# Patient Record
Sex: Female | Born: 1990
Health system: Southern US, Community
[De-identification: ages and names within clinical notes are randomized; demographics above are authoritative.]

## PROBLEM LIST (undated history)

## (undated) ENCOUNTER — Inpatient Hospital Stay (HOSPITAL_COMMUNITY): Payer: Self-pay

## (undated) DIAGNOSIS — D649 Anemia, unspecified: Secondary | ICD-10-CM

## (undated) DIAGNOSIS — Z789 Other specified health status: Secondary | ICD-10-CM

## (undated) DIAGNOSIS — O039 Complete or unspecified spontaneous abortion without complication: Secondary | ICD-10-CM

## (undated) DIAGNOSIS — T8859XA Other complications of anesthesia, initial encounter: Secondary | ICD-10-CM

## (undated) DIAGNOSIS — T4145XA Adverse effect of unspecified anesthetic, initial encounter: Secondary | ICD-10-CM

## (undated) DIAGNOSIS — Z973 Presence of spectacles and contact lenses: Secondary | ICD-10-CM

---
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
## 2011-06-24 LAB — GLUCOSE, CAPILLARY: Glucose-Capillary: 99

## 2011-09-28 ENCOUNTER — Ambulatory Visit: Payer: Self-pay | Admitting: Gynecology

## 2011-09-28 VITALS — BP 103/71 | Wt 159.0 lb

## 2011-09-28 DIAGNOSIS — Z3480 Encounter for supervision of other normal pregnancy, unspecified trimester: Secondary | ICD-10-CM

## 2011-09-28 DIAGNOSIS — O3680X0 Pregnancy with inconclusive fetal viability, not applicable or unspecified: Secondary | ICD-10-CM

## 2011-09-28 LAB — POCT URINE PREGNANCY: Preg Test, Ur: POSITIVE

## 2011-09-29 ENCOUNTER — Encounter (HOSPITAL_COMMUNITY): Payer: Self-pay | Admitting: *Deleted

## 2011-09-29 ENCOUNTER — Inpatient Hospital Stay (HOSPITAL_COMMUNITY)
Admission: AD | Admit: 2011-09-29 | Discharge: 2011-09-30 | Disposition: A | Discharge status: Home / Self Care | Payer: Medicaid Other | Source: Ambulatory Visit | Attending: Family Medicine | Admitting: Family Medicine

## 2011-09-29 DIAGNOSIS — O039 Complete or unspecified spontaneous abortion without complication: Secondary | ICD-10-CM | POA: Insufficient documentation

## 2011-09-29 HISTORY — DX: Other specified health status: Z78.9

## 2011-09-29 LAB — OBSTETRIC PANEL
Basophils Relative: 0 % (ref 0–1)
Eosinophils Absolute: 0.1 10*3/uL (ref 0.0–0.7)
Hepatitis B Surface Ag: NEGATIVE
MCH: 31.4 pg (ref 26.0–34.0)
MCHC: 33.9 g/dL (ref 30.0–36.0)
Neutrophils Relative %: 69 % (ref 43–77)
Platelets: 194 10*3/uL (ref 150–400)
RBC: 4.17 MIL/uL (ref 3.87–5.11)

## 2011-09-29 LAB — HIV ANTIBODY (ROUTINE TESTING W REFLEX): HIV: NONREACTIVE

## 2011-09-29 NOTE — ED Provider Notes (Signed)
History     Chief Complaint  Patient presents with  . Vaginal Bleeding   HPI 21 y.o. with bleeding x 3 days, pink initially, now like a period, + cramping this morning.    Past Medical History  Diagnosis Date  . No pertinent past medical history     Past Surgical History  Procedure Date  . Cesarean section     Family History  Problem Relation Age of Onset  . Diabetes Mother   . Hyperlipidemia Father   . Asthma Sister   . Hypertension Sister     History  Substance Use Topics  . Smoking status: Never Smoker   . Smokeless tobacco: Not on file  . Alcohol Use: No    Allergies: Not on File  No prescriptions prior to admission    Review of Systems  Constitutional: Negative.   Respiratory: Negative.   Cardiovascular: Negative.   Gastrointestinal: Negative for nausea, vomiting, abdominal pain, diarrhea and constipation.  Genitourinary: Negative for dysuria, urgency, frequency, hematuria and flank pain.       Positive for bleeding and cramping  Musculoskeletal: Negative.   Neurological: Negative.   Psychiatric/Behavioral: Negative.    Physical Exam   Blood pressure 115/77, pulse 88, temperature 98.8 F (37.1 C), temperature source Oral, resp. rate 20, height 5\' 1"  (1.549 m), weight 159 lb (72.122 kg).  Physical Exam  Constitutional: She is oriented to person, place, and time. She appears well-developed and well-nourished. No distress.  HENT:  Head: Normocephalic and atraumatic.  Cardiovascular: Normal rate, regular rhythm and normal heart sounds.   Respiratory: Effort normal and breath sounds normal. No respiratory distress.  GI: Soft. Bowel sounds are normal. She exhibits no distension and no mass. There is no tenderness. There is no rebound and no guarding.  Genitourinary: There is no rash or lesion on the right labia. There is no rash or lesion on the left labia. Uterus is not deviated, not enlarged, not fixed and not tender. Cervix exhibits no motion  tenderness, no discharge and no friability. Right adnexum displays no mass, no tenderness and no fullness. Left adnexum displays no mass, no tenderness and no fullness. There is bleeding (moderate) around the vagina. No erythema or tenderness around the vagina. No vaginal discharge found.  Neurological: She is alert and oriented to person, place, and time.  Skin: Skin is warm and dry.  Psychiatric: She has a normal mood and affect.    MAU Course  Procedures  Results for orders placed during the hospital encounter of 09/29/11 (from the past 24 hour(s))  CBC     Status: Abnormal   Collection Time   09/29/11 11:59 PM      Component Value Range   WBC 11.4 (*) 4.0 - 10.5 (K/uL)   RBC 3.99  3.87 - 5.11 (MIL/uL)   Hemoglobin 12.6  12.0 - 15.0 (g/dL)   HCT 16.1 (*) 09.6 - 46.0 (%)   MCV 89.7  78.0 - 100.0 (fL)   MCH 31.6  26.0 - 34.0 (pg)   MCHC 35.2  30.0 - 36.0 (g/dL)   RDW 04.5  40.9 - 81.1 (%)   Platelets 178  150 - 400 (K/uL)   U/S: [redacted]w[redacted]d fetal pole with no cardiac activity   Assessment and Plan  21 y.o. G2P1 with 10 week SAB  Discussed cytotec vs. Expectant mgmt, pt desires expectant mgmt at this time Precautions rev'd F/U in office in 2 weeks or sooner PRN  Bryler Dibble 09/30/2011, 12:14 AM

## 2011-09-29 NOTE — Progress Notes (Signed)
Patient states she started bleeding 3 days ago. It started as a light pink and now the bleeding is as heavy as a period. Cramping started this am.

## 2011-09-30 ENCOUNTER — Other Ambulatory Visit: Payer: Self-pay | Admitting: Obstetrics & Gynecology

## 2011-09-30 ENCOUNTER — Inpatient Hospital Stay (HOSPITAL_COMMUNITY): Payer: Medicaid Other

## 2011-09-30 ENCOUNTER — Inpatient Hospital Stay (HOSPITAL_COMMUNITY)
Admission: AD | Admit: 2011-09-30 | Discharge: 2011-10-01 | Disposition: A | Discharge status: Home / Self Care | Payer: Medicaid Other | Source: Ambulatory Visit | Attending: Obstetrics & Gynecology | Admitting: Obstetrics & Gynecology

## 2011-09-30 ENCOUNTER — Ambulatory Visit (HOSPITAL_COMMUNITY): Admission: RE | Admit: 2011-09-30 | Payer: Self-pay | Source: Ambulatory Visit

## 2011-09-30 ENCOUNTER — Encounter (HOSPITAL_COMMUNITY): Payer: Self-pay

## 2011-09-30 DIAGNOSIS — O039 Complete or unspecified spontaneous abortion without complication: Secondary | ICD-10-CM

## 2011-09-30 LAB — CBC
HCT: 30.3 % — ABNORMAL LOW (ref 36.0–46.0)
Hemoglobin: 10.6 g/dL — ABNORMAL LOW (ref 12.0–15.0)
Hemoglobin: 12.6 g/dL (ref 12.0–15.0)
MCH: 31.6 pg (ref 26.0–34.0)
MCV: 89.7 fL (ref 78.0–100.0)
Platelets: 178 10*3/uL (ref 150–400)
RBC: 3.35 MIL/uL — ABNORMAL LOW (ref 3.87–5.11)
RBC: 3.99 MIL/uL (ref 3.87–5.11)
WBC: 11.4 10*3/uL — ABNORMAL HIGH (ref 4.0–10.5)

## 2011-09-30 LAB — CULTURE, URINE COMPREHENSIVE: Colony Count: NO GROWTH

## 2011-09-30 LAB — URINALYSIS, ROUTINE W REFLEX MICROSCOPIC
Bilirubin Urine: NEGATIVE
Glucose, UA: NEGATIVE mg/dL
Specific Gravity, Urine: 1.01 (ref 1.005–1.030)
Urobilinogen, UA: 0.2 mg/dL (ref 0.0–1.0)
pH: 6.5 (ref 5.0–8.0)

## 2011-09-30 LAB — URINE MICROSCOPIC-ADD ON

## 2011-09-30 LAB — GC/CHLAMYDIA PROBE AMP, GENITAL: GC Probe Amp, Genital: NEGATIVE

## 2011-09-30 LAB — WET PREP, GENITAL
Trich, Wet Prep: NONE SEEN
Yeast Wet Prep HPF POC: NONE SEEN

## 2011-09-30 MED ORDER — MISOPROSTOL 200 MCG PO TABS
800.0000 ug | ORAL_TABLET | Freq: Once | ORAL | Status: AC
  Administered 2011-09-30: 800 ug via RECTAL
  Filled 2011-09-30: qty 4

## 2011-09-30 MED ORDER — KETOROLAC TROMETHAMINE 30 MG/ML IJ SOLN
30.0000 mg | Freq: Once | INTRAMUSCULAR | Status: AC
  Administered 2011-09-30: 30 mg via INTRAVENOUS
  Filled 2011-09-30: qty 1

## 2011-09-30 MED ORDER — HYDROMORPHONE HCL PF 1 MG/ML IJ SOLN
1.0000 mg | Freq: Once | INTRAMUSCULAR | Status: DC
  Filled 2011-09-30: qty 1

## 2011-09-30 MED ORDER — LACTATED RINGERS IV SOLN
Freq: Once | INTRAVENOUS | Status: AC
  Administered 2011-09-30: 23:00:00 via INTRAVENOUS

## 2011-09-30 MED ORDER — LACTATED RINGERS IV SOLN
Freq: Once | INTRAVENOUS | Status: AC
  Administered 2011-09-30: 22:00:00 via INTRAVENOUS

## 2011-09-30 MED ORDER — METHYLERGONOVINE MALEATE 0.2 MG/ML IJ SOLN
0.2000 mg | Freq: Once | INTRAMUSCULAR | Status: AC
  Administered 2011-09-30: 0.2 mg via INTRAMUSCULAR
  Filled 2011-09-30: qty 1

## 2011-09-30 NOTE — ED Provider Notes (Signed)
History     Chief Complaint  Patient presents with  . Vaginal Bleeding   HPI  Pt seen in MAU yesterday for pelvic pain.  Ultrasound completed and showed a 9 wk demise.  Pt in tonight with vaginal bleeding and cramping.  Past Medical History  Diagnosis Date  . No pertinent past medical history     Past Surgical History  Procedure Date  . Cesarean section     Family History  Problem Relation Age of Onset  . Diabetes Mother   . Hyperlipidemia Father   . Asthma Sister   . Hypertension Sister     History  Substance Use Topics  . Smoking status: Never Smoker   . Smokeless tobacco: Not on file  . Alcohol Use: No    Allergies: No Known Allergies  Prescriptions prior to admission  Medication Sig Dispense Refill  . acetaminophen (TYLENOL) 325 MG tablet Take 650 mg by mouth every 6 (six) hours as needed. For pain       . Prenatal Vit-Fe Fumarate-FA (PRENATAL MULTIVITAMIN) TABS Take 1 tablet by mouth daily.          Review of Systems  Gastrointestinal: Positive for abdominal pain.  Genitourinary:       Vaginal bleeding  All other systems reviewed and are negative.   Physical Exam   Blood pressure 121/74, pulse 90, temperature 99 F (37.2 C), temperature source Oral, resp. rate 20, SpO2 96.00%.  Physical Exam  Constitutional: She is oriented to person, place, and time. She appears well-developed and well-nourished. She appears distressed.       Uncomfortable appearing  HENT:  Head: Normocephalic.  Neck: Normal range of motion. Neck supple.  Cardiovascular: Normal rate, regular rhythm and normal heart sounds.   Respiratory: Effort normal and breath sounds normal. No respiratory distress.  GI: Soft. She exhibits no mass. There is tenderness (suprapubic). There is no rebound and no guarding.  Genitourinary: There is bleeding (moderate; +clots) around the vagina.  Neurological: She is alert and oriented to person, place, and time.  Skin: Skin is warm and dry.     MAU Course  Procedures  Consult with Dr. Lennox Solders cytotec  IV LR 1000 x 2 Cytotec 800 mcg rectally  Dr. Despina Hidden in to evaluate pt>obtains tissue from cervical os; IM Methergine given  Assessment and Plan  Miscarriage  Plan: DC to home PO Methergine, 0.2 mg every 4 hours x 5 Bleeding precautions given Keep scheduled appointment at [facility] in two weeks.  [facility] 09/30/2011, 10:18 PM

## 2011-09-30 NOTE — ED Provider Notes (Signed)
Chart reviewed and agree with management and plan.  

## 2011-09-30 NOTE — ED Notes (Signed)
Changed pad under patient, large amount of bright red blood noted, no active vaginal bleeding noted.

## 2011-09-30 NOTE — Consult Note (Signed)
Dr. Despina Hidden notified regarding pt bleeding/vitals>will come evaluate pt

## 2011-09-30 NOTE — ED Notes (Signed)
Speculum exam by Dr. Despina Hidden, fetal tissue removed.

## 2011-09-30 NOTE — ED Notes (Signed)
Patient discharged at 0110. Epic down during this time. See paper chart.

## 2011-10-01 MED ORDER — ONDANSETRON HCL 4 MG/2ML IJ SOLN
4.0000 mg | Freq: Once | INTRAMUSCULAR | Status: AC
  Administered 2011-10-01: 4 mg via INTRAVENOUS
  Filled 2011-10-01: qty 2

## 2011-10-01 MED ORDER — METHYLERGONOVINE MALEATE 0.2 MG PO TABS: 0.2000 mg | ORAL_TABLET | Freq: Four times a day (QID) | ORAL | Status: AC

## 2011-10-01 MED ORDER — LACTATED RINGERS IV SOLN: Freq: Once | INTRAVENOUS | Status: DC

## 2011-10-01 NOTE — Progress Notes (Signed)
Pt bleeding decreased on pad; light, no clots; reports an improvement in uterine pain.

## 2011-10-06 ENCOUNTER — Encounter: Payer: Self-pay | Admitting: Obstetrics and Gynecology

## 2011-10-07 ENCOUNTER — Encounter: Payer: Self-pay | Admitting: Obstetrics & Gynecology

## 2011-10-12 ENCOUNTER — Ambulatory Visit (INDEPENDENT_AMBULATORY_CARE_PROVIDER_SITE_OTHER): Payer: Self-pay | Admitting: Family Medicine

## 2011-10-12 ENCOUNTER — Encounter: Payer: Self-pay | Admitting: Family Medicine

## 2011-10-12 VITALS — BP 96/65 | HR 82 | Ht 61.0 in | Wt 158.0 lb

## 2011-10-12 DIAGNOSIS — O039 Complete or unspecified spontaneous abortion without complication: Secondary | ICD-10-CM

## 2011-10-12 NOTE — Progress Notes (Signed)
History Here 2 wks after SAB at hospital.  Her bleeding has diminished.  Her mood is well.  Discussed risks of pregnancy within 3 mos. Of miscarriage, likely causes, recurrence risk.   Physical exam Filed Vitals:   10/12/11 1521  BP: 96/65  Pulse: 82  abdomen:  Soft, NT, uterus is small and involuted.  Assessment S/p SAB  Plan F/u 3 mos.

## 2011-10-12 NOTE — Patient Instructions (Signed)
Miscarriage An early pregnancy loss or spontaneous abortion (miscarriage) is a common problem. This usually happens when the pregnancy is not developing normally. It is very unlikely that you or your partner did anything to cause this, although cigarette smoking, a sexually transmitted disease, excessive alcohol use, or drug abuse can increase the risk. Other causes are:  Abnormalities of the uterus.   Hormone or medical problems.   Trauma or genetic (chromosome) problems.  Having a miscarriage does not change your chances of having a normal pregnancy in the future. Your caregiver will advise you when it is safe to try to get pregnant again. AFTER A MISCARRIAGE  A miscarriage is inevitable when there is continual, heavy vaginal bleeding; cramping; dilation of the cervix; or passing of any pregnancy tissue. Bleeding and cramping will usually continue until all the tissue has been removed from the womb (uterus).   Often the uterus does not clean itself out completely. A medication or a D&C procedure is needed to loosen or remove the pregnancy tissue from the uterus. A D&C scrapes or suctions the tissue out.   If you are RH negative, you may need to have Rh immune globulin to avoid Rh problems.   You may be given medication to fight an infection if the miscarriage was due to an infection.  HOME CARE INSTRUCTIONS   You should rest in bed for the next 2 to 3 days.   Do not take tub baths or put anything in your vagina, including tampons or a douche.   Do not have sex until your caregiver approves.   Avoid exercise or heavy activities until directed by your caregiver.   Save any vaginal discharge that looks like tissue. Ask your caregiver if he or she wants to inspect the discharge.   If you and your partner are having problems with guilt or grieving, talk to your caregiver or get counseling to help you understand and cope with your pregnancy loss.   Allow enough time to grieve before  trying to get pregnant again.  SEEK IMMEDIATE MEDICAL CARE IF:   You have persistent heavy bleeding or a bad smelling vaginal discharge.   You have continued abdominal or pelvic pain.   You have an oral temperature above 102 F (38.9 C), not controlled by medicine.   You have severe weakness, fainting, or keep throwing up (vomiting).   You develop chills.   You are experiencing domestic violence.  MAKE SURE YOU:   Understand these instructions.   Will watch your condition.   Will get help right away if you are not doing well or get worse.  Document Released: 10/15/2004 Document Revised: 05/20/2011 Document Reviewed: 08/30/2008 Exit[employer] Patient Information 2012 [employer]. 

## 2011-11-19 ENCOUNTER — Encounter (HOSPITAL_COMMUNITY): Payer: Self-pay

## 2012-11-07 ENCOUNTER — Ambulatory Visit (INDEPENDENT_AMBULATORY_CARE_PROVIDER_SITE_OTHER): Payer: Self-pay | Admitting: *Deleted

## 2012-11-07 ENCOUNTER — Encounter: Payer: Self-pay | Admitting: *Deleted

## 2012-11-07 VITALS — BP 106/74 | Wt 169.0 lb

## 2012-11-07 DIAGNOSIS — Z3480 Encounter for supervision of other normal pregnancy, unspecified trimester: Secondary | ICD-10-CM

## 2012-11-07 LAB — HIV ANTIBODY (ROUTINE TESTING W REFLEX): HIV: NONREACTIVE

## 2012-11-07 NOTE — Progress Notes (Signed)
Patient is here for New OB intake, she did miscarry with her previous pregnancy and would like to have a blood test today to confirm her HCG level looks appropriate.  She will consider first trimester screening, she is early so has time to discuss with her sister and her husband.  She will follow back up here with the physician within two weeks.  Blood work done today.

## 2012-11-08 LAB — OBSTETRIC PANEL
Antibody Screen: NEGATIVE
Eosinophils Relative: 1 % (ref 0–5)
HCT: 36.6 % (ref 36.0–46.0)
Lymphocytes Relative: 25 % (ref 12–46)
Lymphs Abs: 1.7 10*3/uL (ref 0.7–4.0)
MCV: 89.3 fL (ref 78.0–100.0)
Monocytes Absolute: 0.4 10*3/uL (ref 0.1–1.0)
Monocytes Relative: 6 % (ref 3–12)
RBC: 4.1 MIL/uL (ref 3.87–5.11)
Rh Type: POSITIVE
Rubella: 0.99 Index — ABNORMAL HIGH (ref <0.90)
WBC: 6.8 10*3/uL (ref 4.0–10.5)

## 2012-11-22 ENCOUNTER — Encounter: Payer: Self-pay | Admitting: Obstetrics & Gynecology

## 2012-11-28 ENCOUNTER — Ambulatory Visit (INDEPENDENT_AMBULATORY_CARE_PROVIDER_SITE_OTHER): Payer: Self-pay | Admitting: Obstetrics & Gynecology

## 2012-11-28 VITALS — BP 121/75 | Wt 175.0 lb

## 2012-11-28 DIAGNOSIS — O34219 Maternal care for unspecified type scar from previous cesarean delivery: Secondary | ICD-10-CM

## 2012-11-28 DIAGNOSIS — Z124 Encounter for screening for malignant neoplasm of cervix: Secondary | ICD-10-CM

## 2012-11-28 DIAGNOSIS — Z113 Encounter for screening for infections with a predominantly sexual mode of transmission: Secondary | ICD-10-CM

## 2012-11-28 DIAGNOSIS — Z3480 Encounter for supervision of other normal pregnancy, unspecified trimester: Secondary | ICD-10-CM

## 2012-11-28 NOTE — Progress Notes (Signed)
   Subjective:    Victoria Lopez is a G3P2 [redacted]w[redacted]d being seen today for her first obstetrical visit.  Patient does intend to breast feed. Pregnancy history fully reviewed.  Patient reports no complaints.  Filed Vitals:   11/28/12 1321  BP: 121/75  Weight: 175 lb (79.379 kg)    HISTORY: OB History   Grav Para Term Preterm Abortions TAB SAB Ect Mult Living   3 1        1      # Outc Date GA Lbr Len/2nd Wgt Sex Del Anes PTL Lv   1 TRM 11/08 [redacted]w[redacted]d  8lb4oz(3.742kg) F SVD EPI No Yes   2 SAB 1/13        ND   Comments: System Generated. Please review and update pregnancy details.   3 CUR            4 PAR      CS      5 GRA            Comments: EDD: 03/27/12; System Generated. Please review and update pregnancy details.     Past Medical History  Diagnosis Date  . No pertinent past medical history    Past Surgical History  Procedure Laterality Date  . Cesarean section     Family History  Problem Relation Age of Onset  . Diabetes Mother   . Hyperlipidemia Father   . Hypertension Sister   . Asthma Sister   . Alcohol abuse Paternal Grandfather      Exam    Uterus:     Pelvic Exam:    Perineum: No Hemorrhoids, Normal Perineum   Vulva: normal   Vagina:  normal mucosa, normal discharge   Cervix: no bleeding following Pap   Adnexa: normal adnexa and no mass, fullness, tenderness   Bony Pelvis: average  System: Breast:  normal appearance, no masses or tenderness   Skin: normal coloration and turgor, no rashes    Neurologic: oriented, normal   Extremities: normal strength, tone, and muscle mass   HEENT PERRLA   Mouth/Teeth mucous membranes moist, pharynx normal without lesions and dental hygiene good   Neck supple and no masses   Cardiovascular: regular rate and rhythm   Respiratory:  appears well, vitals normal, no respiratory distress, acyanotic, normal RR   Abdomen: soft, non-tender; bowel sounds normal; no masses,  no organomegaly   Urinary: urethral meatus normal       Assessment:    Pregnancy: G3P1 Patient Active Problem List  Diagnosis  . Previous cesarean delivery, antepartum condition or complication      Plan:   Initial labs normal Continue prenatal vitamins. Problem list reviewed and updated. Genetic Screening discussed First Screen, Integrated Screen and Quad Screen: desires Quad screen, will be drawn in second trimester Ultrasound discussed; fetal survey: will be ordered later. Follow up in 4 weeks. Bleeding precautions reviewed.    ANYANWU,UGONNA A 11/28/2012

## 2012-11-28 NOTE — Patient Instructions (Signed)
Pregnancy - First Trimester During sexual intercourse, millions of sperm go into the vagina. Only 1 sperm will penetrate and fertilize the female egg while it is in the Fallopian tube. One week later, the fertilized egg implants into the wall of the uterus. An embryo begins to develop into a baby. At 6 to 8 weeks, the eyes and face are formed and the heartbeat can be seen on ultrasound. At the end of 12 weeks (first trimester), all the baby's organs are formed. Now that you are pregnant, you will want to do everything you can to have a healthy baby. Two of the most important things are to get good prenatal care and follow your caregiver's instructions. Prenatal care is all the medical care you receive before the baby's birth. It is given to prevent, find, and treat problems during the pregnancy and childbirth. PRENATAL EXAMS  During prenatal visits, your weight, blood pressure and urine are checked. This is done to make sure you are healthy and progressing normally during the pregnancy.  A pregnant woman should gain 25 to 35 pounds during the pregnancy. However, if you are over weight or underweight, your caregiver will advise you regarding your weight.  Your caregiver will ask and answer questions for you.  Blood work, cervical cultures, other necessary tests and a Pap test are done during your prenatal exams. These tests are done to check on your health and the probable health of your baby. Tests are strongly recommended and done for HIV with your permission. This is the virus that causes AIDS. These tests are done because medications can be given to help prevent your baby from being born with this infection should you have been infected without knowing it. Blood work is also used to find out your blood type, previous infections and follow your blood levels (hemoglobin).  Low hemoglobin (anemia) is common during pregnancy. Iron and vitamins are given to help prevent this. Later in the pregnancy, blood  tests for diabetes will be done along with any other tests if any problems develop. You may need tests to make sure you and the baby are doing well.  You may need other tests to make sure you and the baby are doing well. CHANGES DURING THE FIRST TRIMESTER (THE FIRST 3 MONTHS OF PREGNANCY) Your body goes through many changes during pregnancy. They vary from person to person. Talk to your caregiver about changes you notice and are concerned about. Changes can include:  Your menstrual period stops.  The egg and sperm carry the genes that determine what you look like. Genes from you and your partner are forming a baby. The female genes determine whether the baby is a boy or a girl.  Your body increases in girth and you may feel bloated.  Feeling sick to your stomach (nauseous) and throwing up (vomiting). If the vomiting is uncontrollable, call your caregiver.  Your breasts will begin to enlarge and become tender.  Your nipples may stick out more and become darker.  The need to urinate more. Painful urination may mean you have a bladder infection.  Tiring easily.  Loss of appetite.  Cravings for certain kinds of food.  At first, you may gain or lose a couple of pounds.  You may have changes in your emotions from day to day (excited to be pregnant or concerned something may go wrong with the pregnancy and baby).  You may have more vivid and strange dreams. HOME CARE INSTRUCTIONS   It is very important   to avoid all smoking, alcohol and un-prescribed drugs during your pregnancy. These affect the formation and growth of the baby. Avoid chemicals while pregnant to ensure the delivery of a healthy infant.  Start your prenatal visits by the 12th week of pregnancy. They are usually scheduled monthly at first, then more often in the last 2 months before delivery. Keep your caregiver's appointments. Follow your caregiver's instructions regarding medication use, blood and lab tests, exercise, and  diet.  During pregnancy, you are providing food for you and your baby. Eat regular, well-balanced meals. Choose foods such as meat, fish, milk and other low fat dairy products, vegetables, fruits, and whole-grain breads and cereals. Your caregiver will tell you of the ideal weight gain.  You can help morning sickness by keeping soda crackers at the bedside. Eat a couple before arising in the morning. You may want to use the crackers without salt on them.  Eating 4 to 5 small meals rather than 3 large meals a day also may help the nausea and vomiting.  Drinking liquids between meals instead of during meals also seems to help nausea and vomiting.  A physical sexual relationship may be continued throughout pregnancy if there are no other problems. Problems may be early (premature) leaking of amniotic fluid from the membranes, vaginal bleeding, or belly (abdominal) pain.  Exercise regularly if there are no restrictions. Check with your caregiver or physical therapist if you are unsure of the safety of some of your exercises. Greater weight gain will occur in the last 2 trimesters of pregnancy. Exercising will help:  Control your weight.  Keep you in shape.  Prepare you for labor and delivery.  Help you lose your pregnancy weight after you deliver your baby.  Wear a good support or jogging bra for breast tenderness during pregnancy. This may help if worn during sleep too.  Ask when prenatal classes are available. Begin classes when they are offered.  Do not use hot tubs, steam rooms or saunas.  Wear your seat belt when driving. This protects you and your baby if you are in an accident.  Avoid raw meat, uncooked cheese, cat litter boxes and soil used by cats throughout the pregnancy. These carry germs that can cause birth defects in the baby.  The first trimester is a good time to visit your dentist for your dental health. Getting your teeth cleaned is OK. Use a softer toothbrush and brush  gently during pregnancy.  Ask for help if you have financial, counseling or nutritional needs during pregnancy. Your caregiver will be able to offer counseling for these needs as well as refer you for other special needs.  Do not take any medications or herbs unless told by your caregiver.  Inform your caregiver if there is any mental or physical domestic violence.  Make a list of emergency phone numbers of family, friends, hospital, and police and fire departments.  Write down your questions. Take them to your prenatal visit.  Do not douche.  Do not cross your legs.  If you have to stand for long periods of time, rotate you feet or take small steps in a circle.  You may have more vaginal secretions that may require a sanitary pad. Do not use tampons or scented sanitary pads. MEDICATIONS AND DRUG USE IN PREGNANCY  Take prenatal vitamins as directed. The vitamin should contain 1 milligram of folic acid. Keep all vitamins out of reach of children. Only a couple vitamins or tablets containing iron may be   fatal to a baby or young child when ingested.  Avoid use of all medications, including herbs, over-the-counter medications, not prescribed or suggested by your caregiver. Only take over-the-counter or prescription medicines for pain, discomfort, or fever as directed by your caregiver. Do not use aspirin, ibuprofen, or naproxen unless directed by your caregiver.  Let your caregiver also know about herbs you may be using.  Alcohol is related to a number of birth defects. This includes fetal alcohol syndrome. All alcohol, in any form, should be avoided completely. Smoking will cause low birth rate and premature babies.  Street or illegal drugs are very harmful to the baby. They are absolutely forbidden. A baby born to an addicted mother will be addicted at birth. The baby will go through the same withdrawal an adult does.  Let your caregiver know about any medications that you have to take  and for what reason you take them. MISCARRIAGE IS COMMON DURING PREGNANCY A miscarriage does not mean you did something wrong. It is not a reason to worry about getting pregnant again. Your caregiver will help you with questions you may have. If you have a miscarriage, you may need minor surgery. SEEK MEDICAL CARE IF:  You have any concerns or worries during your pregnancy. It is better to call with your questions if you feel they cannot wait, rather than worry about them. SEEK IMMEDIATE MEDICAL CARE IF:   An unexplained oral temperature above 102 F (38.9 C) develops, or as your caregiver suggests.  You have leaking of fluid from the vagina (birth canal). If leaking membranes are suspected, take your temperature and inform your caregiver of this when you call.  There is vaginal spotting or bleeding. Notify your caregiver of the amount and how many pads are used.  You develop a bad smelling vaginal discharge with a change in the color.  You continue to feel sick to your stomach (nauseated) and have no relief from remedies suggested. You vomit blood or coffee ground-like materials.  You lose more than 2 pounds of weight in 1 week.  You gain more than 2 pounds of weight in 1 week and you notice swelling of your face, hands, feet, or legs.  You gain 5 pounds or more in 1 week (even if you do not have swelling of your hands, face, legs, or feet).  You get exposed to German measles and have never had them.  You are exposed to fifth disease or chickenpox.  You develop belly (abdominal) pain. Round ligament discomfort is a common non-cancerous (benign) cause of abdominal pain in pregnancy. Your caregiver still must evaluate this.  You develop headache, fever, diarrhea, pain with urination, or shortness of breath.  You fall or are in a car accident or have any kind of trauma.  There is mental or physical violence in your home. Document Released: 09/01/2001 Document Revised: 11/30/2011  Document Reviewed: 03/05/2009 [employer] Patient Information 2013 [employer].  

## 2012-11-28 NOTE — Progress Notes (Signed)
P - 81 - Pt is here one day early for appt due to brownish discharge. Discharge has been 2 days - pt states no pain/pressure/contractions.

## 2012-11-29 ENCOUNTER — Encounter: Payer: Self-pay | Admitting: Obstetrics and Gynecology

## 2012-11-29 ENCOUNTER — Encounter: Payer: Self-pay | Admitting: Obstetrics & Gynecology

## 2012-12-01 ENCOUNTER — Inpatient Hospital Stay (HOSPITAL_COMMUNITY): Payer: Self-pay

## 2012-12-01 ENCOUNTER — Encounter (HOSPITAL_COMMUNITY): Payer: Self-pay | Admitting: *Deleted

## 2012-12-01 ENCOUNTER — Inpatient Hospital Stay (HOSPITAL_COMMUNITY)
Admission: AD | Admit: 2012-12-01 | Discharge: 2012-12-01 | Disposition: A | Discharge status: Home / Self Care | Payer: Self-pay | Source: Ambulatory Visit | Attending: Obstetrics and Gynecology | Admitting: Obstetrics and Gynecology

## 2012-12-01 DIAGNOSIS — O200 Threatened abortion: Secondary | ICD-10-CM

## 2012-12-01 DIAGNOSIS — O021 Missed abortion: Secondary | ICD-10-CM | POA: Insufficient documentation

## 2012-12-01 HISTORY — DX: Other specified health status: Z78.9

## 2012-12-01 LAB — CBC
HCT: 36.6 % (ref 36.0–46.0)
MCHC: 34.2 g/dL (ref 30.0–36.0)
MCV: 89.3 fL (ref 78.0–100.0)
RDW: 12.8 % (ref 11.5–15.5)

## 2012-12-01 NOTE — MAU Note (Signed)
Brown d/c started on Sun, called [facility]- was told ok if not active - bright red bleeding.  Now is bright red, covered a panti liner today. Pelvic pressure, no pain. Hx of SAB

## 2012-12-01 NOTE — MAU Note (Signed)
Pt reports vaginal bleeding today, started as brownish discharge but then changed to bright red. On tissue when she wipes and also on her underwear. Lower back pain and lower abd pressure this pm.

## 2012-12-01 NOTE — MAU Provider Note (Signed)
History     CSN: [id number]  Arrival date and time: 12/01/12 1836   First Kenyon Eshleman Initiated Contact with Patient 12/01/12 2040      No chief complaint on file.  HPI  Pt is is G3P1011 and presents with brown discharge that has turned to bright red spotting.  Today she had onset of back pain and pressure.  She denies pain with urination or constipation.  She has not taken anything for the pain.  Pt has hx of previous miscarriage on 09/2011. Pt was seen for her initial OB work up at [facility] on 11/07/2012.  Her HCG was 18,928.  FHT were not obtained at that time.  Pt did not have an ultrasound.  Pt is O POS  Past Medical History  Diagnosis Date  . No pertinent past medical history   . Medical history non-contributory     Past Surgical History  Procedure Laterality Date  . Cesarean section      Family History  Problem Relation Age of Onset  . Diabetes Mother   . Hyperlipidemia Father   . Hypertension Sister   . Asthma Sister   . Alcohol abuse Paternal Grandfather     History  Substance Use Topics  . Smoking status: Never Smoker   . Smokeless tobacco: Not on file  . Alcohol Use: No    Allergies: No Known Allergies  Prescriptions prior to admission  Medication Sig Dispense Refill  . ferrous sulfate 325 (65 FE) MG tablet Take 325 mg by mouth every other day.       . Prenatal Vit-Fe Fumarate-FA (MULTIVITAMIN-PRENATAL) 27-0.8 MG TABS Take 1 tablet by mouth daily.          ROS Physical Exam   Blood pressure 122/71, pulse 74, temperature 98.3 F (36.8 C), temperature source Oral, resp. rate 18, height 5\' 1"  (1.549 m), weight 174 lb (78.926 kg), last menstrual period 09/10/2012.  Physical Exam  Vitals reviewed. Constitutional: She appears well-developed and well-nourished.  HENT:  Head: Normocephalic.  Eyes: Pupils are equal, round, and reactive to light.  Neck: Normal range of motion. Neck supple.  Respiratory: Effort normal.  GI: Soft.  Genitourinary:   Small amount of dark brown discharge in vault; cervix closed, NT; uterus NSSC NT; adnexa without palpable enlargement or tenderness  Musculoskeletal: Normal range of motion.  Neurological: She is alert.  Skin: Skin is warm and dry.  Psychiatric: She has a normal mood and affect.    MAU Course  Procedures Results for orders placed during the hospital encounter of 12/01/12 (from the past 24 hour(s))  CBC     Status: None   Collection Time    12/01/12  8:30 PM      Result Value Range   WBC 7.7  4.0 - 10.5 K/uL   RBC 4.10  3.87 - 5.11 MIL/uL   Hemoglobin 12.5  12.0 - 15.0 g/dL   HCT 40.9  81.1 - 91.4 %   MCV 89.3  78.0 - 100.0 fL   MCH 30.5  26.0 - 34.0 pg   MCHC 34.2  30.0 - 36.0 g/dL   RDW 78.2  95.6 - 21.3 %   Platelets 160  150 - 400 K/uL  HCG, QUANTITATIVE, PREGNANCY     Status: Abnormal   Collection Time    12/01/12  8:30 PM      Result Value Range   hCG, Beta Chain, Quant, S 7188 (*) <5 mIU/mL  WET PREP, GENITAL     Status:  Abnormal   Collection Time    12/01/12  8:51 PM      Result Value Range   Yeast Wet Prep HPF POC NONE SEEN  NONE SEEN   Trich, Wet Prep NONE SEEN  NONE SEEN   Clue Cells Wet Prep HPF POC NONE SEEN  NONE SEEN   WBC, Wet Prep HPF POC FEW (*) NONE SEEN   Preliminary ultrasound result showed "abnormal appearing fetus; no fetal cardiac activity; echoes in amniotic fluid; final report in AM" Discussed with Dr. Jolayne Panther- since pt's HCG had dropped and this was not a normal ultrasound- pt told that she had a failed pregnancy;Since pt has been seen at [facility], pt was advised to call them in the morning in hopes that they would have final report and could advise pt about follow up This is the 2nd SAB for pt Assessment and Plan  Missed ab F/u with [facility] 12/01/2012, 8:40 PM

## 2012-12-02 LAB — GC/CHLAMYDIA PROBE AMP
CT Probe RNA: NEGATIVE
GC Probe RNA: NEGATIVE

## 2012-12-02 NOTE — MAU Provider Note (Signed)
Attestation of Attending Supervision of Advanced Practitioner (CNM/NP): Evaluation and management procedures were performed by the Advanced Practitioner under my supervision and collaboration.  I have reviewed the Advanced Practitioner's note and chart, and I agree with the management and plan.  CONSTANT,PEGGY 12/02/2012 8:15 AM

## 2012-12-05 ENCOUNTER — Encounter (HOSPITAL_COMMUNITY): Payer: Self-pay

## 2012-12-05 ENCOUNTER — Inpatient Hospital Stay (HOSPITAL_COMMUNITY): Payer: Self-pay

## 2012-12-05 ENCOUNTER — Inpatient Hospital Stay (HOSPITAL_COMMUNITY)
Admission: AD | Admit: 2012-12-05 | Discharge: 2012-12-05 | Disposition: A | Discharge status: Home / Self Care | Payer: Self-pay | Source: Ambulatory Visit | Attending: Obstetrics & Gynecology | Admitting: Obstetrics & Gynecology

## 2012-12-05 DIAGNOSIS — O039 Complete or unspecified spontaneous abortion without complication: Secondary | ICD-10-CM | POA: Insufficient documentation

## 2012-12-05 DIAGNOSIS — O34219 Maternal care for unspecified type scar from previous cesarean delivery: Secondary | ICD-10-CM

## 2012-12-05 LAB — CBC
Hemoglobin: 11.6 g/dL — ABNORMAL LOW (ref 12.0–15.0)
RBC: 3.86 MIL/uL — ABNORMAL LOW (ref 3.87–5.11)
WBC: 6.8 10*3/uL (ref 4.0–10.5)

## 2012-12-05 LAB — HCG, QUANTITATIVE, PREGNANCY: hCG, Beta Chain, Quant, S: 4228 m[IU]/mL — ABNORMAL HIGH (ref <5)

## 2012-12-05 MED ORDER — KETOROLAC TROMETHAMINE 60 MG/2ML IM SOLN
60.0000 mg | Freq: Once | INTRAMUSCULAR | Status: AC
  Administered 2012-12-05: 60 mg via INTRAMUSCULAR
  Filled 2012-12-05: qty 2

## 2012-12-05 NOTE — MAU Provider Note (Signed)
Attestation of Attending Supervision of Advanced Practitioner (CNM/NP): Evaluation and management procedures were performed by the Advanced Practitioner under my supervision and collaboration. I have reviewed the Advanced Practitioner's note and chart, and I agree with the management and plan.  Ariel Wingrove H. 4:48 PM

## 2012-12-05 NOTE — MAU Provider Note (Signed)
History     CSN: [id number]  Arrival date and time: 12/05/12 1147   First Provider Initiated Contact with Patient 12/05/12 1211      No chief complaint on file.  HPI  Pt returns to MAU after diagnosis of failed pregnancy on 3/13 and began having heavy bleeding and cramping this morning.  Pt passed clots but no known tissue.  Past Medical History  Diagnosis Date  . No pertinent past medical history   . Medical history non-contributory     Past Surgical History  Procedure Laterality Date  . Cesarean section      Family History  Problem Relation Age of Onset  . Diabetes Mother   . Hyperlipidemia Father   . Hypertension Sister   . Asthma Sister   . Alcohol abuse Paternal Grandfather     History  Substance Use Topics  . Smoking status: Never Smoker   . Smokeless tobacco: Not on file  . Alcohol Use: No    Allergies: No Known Allergies  Prescriptions prior to admission  Medication Sig Dispense Refill  . ibuprofen (ADVIL,MOTRIN) 200 MG tablet Take 400 mg by mouth every 6 (six) hours as needed for pain.        Review of Systems  Constitutional: Negative for fever and chills.  Gastrointestinal: Positive for abdominal pain. Negative for nausea and vomiting.  Genitourinary: Negative for dysuria and urgency.   Physical Exam   Blood pressure 111/53, pulse 73, temperature 98.4 F (36.9 C), temperature source Oral, resp. rate 18, last menstrual period 09/10/2012, SpO2 100.00%.  Physical Exam  Nursing note and vitals reviewed. Constitutional: She is oriented to person, place, and time. She appears well-developed and well-nourished.  Painful with cramping- gets relief between cramping  HENT:  Head: Normocephalic.  Eyes: Pupils are equal, round, and reactive to light.  Neck: Normal range of motion. Neck supple.  Cardiovascular: Normal rate.   Respiratory: Effort normal.  GI: Soft. She exhibits no distension. There is no tenderness. There is no rebound and no  guarding.  Genitourinary:  Mod amount of bright red blood in vault with clots; cervix dilated  Musculoskeletal: Normal range of motion.  Neurological: She is alert and oriented to person, place, and time.  Skin: Skin is warm and dry.  Psychiatric: She has a normal mood and affect.    MAU Course  Procedures Results for orders placed during the hospital encounter of 12/05/12 (from the past 24 hour(s))  CBC     Status: Abnormal   Collection Time    12/05/12 12:10 PM      Result Value Range   WBC 6.8  4.0 - 10.5 K/uL   RBC 3.86 (*) 3.87 - 5.11 MIL/uL   Hemoglobin 11.6 (*) 12.0 - 15.0 g/dL   HCT 78.2 (*) 95.6 - 21.3 %   MCV 89.9  78.0 - 100.0 fL   MCH 30.1  26.0 - 34.0 pg   MCHC 33.4  30.0 - 36.0 g/dL   RDW 08.6  57.8 - 46.9 %   Platelets 140 (*) 150 - 400 K/uL  HCG, QUANTITATIVE, PREGNANCY     Status: Abnormal   Collection Time    12/05/12 12:10 PM      Result Value Range   hCG, Beta Chain, Quant, S 4228 (*) <5 mIU/mL  prior to ultrasound; clots and blood removed with ring forceps; on return from ultrasound, pt experienced increase in pain and bleeding.  Speculum exam reveal mod-large amount of blood with tissue removed with  ring forceps-(sent to pathology) bleeding slowed down and cramping stopped Reassessed 20 minutes later- pt not cramping or heavy bleeding- feels like going home Will give Toradol 60mg  IM before discharged- to f/u with [facility]  Assessment and Plan  SAB F/u in 1 week at [facility] for [facility]; return sooner if increase in pain or bleeding  Raif Chachere 12/05/2012, 2:03 PM

## 2012-12-12 ENCOUNTER — Telehealth: Payer: Self-pay | Admitting: *Deleted

## 2012-12-12 NOTE — Telephone Encounter (Signed)
Spoke to patient and she already has a follow up appointment tomorrow with Dr. Macon Large.  She is aware Dr. Penne Lash would like for her to have weekly HCG levels.

## 2012-12-13 ENCOUNTER — Encounter: Payer: Self-pay | Admitting: Obstetrics & Gynecology

## 2012-12-13 ENCOUNTER — Ambulatory Visit (INDEPENDENT_AMBULATORY_CARE_PROVIDER_SITE_OTHER): Payer: Self-pay | Admitting: Obstetrics & Gynecology

## 2012-12-13 VITALS — BP 104/65 | Wt 171.0 lb

## 2012-12-13 DIAGNOSIS — O039 Complete or unspecified spontaneous abortion without complication: Secondary | ICD-10-CM | POA: Insufficient documentation

## 2012-12-13 NOTE — Progress Notes (Signed)
Patient was seen in MAU and has had a SAB.  Dr. Penne Lash has asked that we do weekly quant visits until less than 2 is achieved.  She is still having bleeding and had a rather large sac like clot come out this morning about the size of a kiwi.

## 2012-12-13 NOTE — Progress Notes (Signed)
Patient is doing well, just having spotting now.  Will check quant HCG today and weekly until < 2.  After 6 weeks, consider thrombophilia evaluation given two consecutive SABs.

## 2012-12-13 NOTE — Patient Instructions (Signed)
Return to clinic for any obstetric concerns or go to MAU for evaluation  

## 2012-12-14 LAB — HCG, QUANTITATIVE, PREGNANCY: hCG, Beta Chain, Quant, S: 145 m[IU]/mL

## 2012-12-21 ENCOUNTER — Other Ambulatory Visit: Payer: Self-pay | Admitting: *Deleted

## 2012-12-26 ENCOUNTER — Other Ambulatory Visit (INDEPENDENT_AMBULATORY_CARE_PROVIDER_SITE_OTHER): Payer: Self-pay | Admitting: *Deleted

## 2012-12-26 DIAGNOSIS — O021 Missed abortion: Secondary | ICD-10-CM

## 2012-12-26 NOTE — Progress Notes (Signed)
Patient is here today for hcg quant blood draw.

## 2012-12-27 ENCOUNTER — Encounter: Payer: Self-pay | Admitting: Family Medicine

## 2013-09-21 NOTE — L&D Delivery Note (Signed)
Delivery Note At 1:37 PM a viable female was delivered via Vaginal, Spontaneous Delivery (Presentation: LOA; Occiput Anterior).  APGAR: 9, 9; weight pending.   Placenta status: Intact, Spontaneous.  Cord: 3 vessels with the following complications: None.  Loose nuchal cord x 1 delivered through.  Anesthesia: Epidural  Episiotomy: None Lacerations: 2nd degree;Perineal Suture Repair: 3.0 vicryl rapide Est. Blood Loss (mL): 400  Mom to postpartum.  Baby to Couplet care / Skin to Skin.  Maty Zeisler D 03/04/2014, 2:16 PM

## 2013-10-13 LAB — OB RESULTS CONSOLE RPR: RPR: NONREACTIVE

## 2013-10-13 LAB — OB RESULTS CONSOLE HEPATITIS B SURFACE ANTIGEN: Hepatitis B Surface Ag: NEGATIVE

## 2013-10-13 LAB — OB RESULTS CONSOLE ABO/RH: RH Type: POSITIVE

## 2013-10-13 LAB — OB RESULTS CONSOLE HIV ANTIBODY (ROUTINE TESTING): HIV: NONREACTIVE

## 2013-10-13 LAB — OB RESULTS CONSOLE RUBELLA ANTIBODY, IGM: Rubella: NON-IMMUNE/NOT IMMUNE

## 2013-10-13 LAB — OB RESULTS CONSOLE ANTIBODY SCREEN: Antibody Screen: NEGATIVE

## 2013-11-21 ENCOUNTER — Encounter (HOSPITAL_COMMUNITY): Payer: Self-pay | Admitting: Emergency Medicine

## 2013-11-21 ENCOUNTER — Emergency Department (HOSPITAL_COMMUNITY)
Admission: EM | Admit: 2013-11-21 | Discharge: 2013-11-21 | Disposition: A | Discharge status: Home / Self Care | Payer: Medicaid Other | Attending: Emergency Medicine | Admitting: Emergency Medicine

## 2013-11-21 DIAGNOSIS — Z79899 Other long term (current) drug therapy: Secondary | ICD-10-CM | POA: Insufficient documentation

## 2013-11-21 DIAGNOSIS — O219 Vomiting of pregnancy, unspecified: Secondary | ICD-10-CM | POA: Insufficient documentation

## 2013-11-21 DIAGNOSIS — O9989 Other specified diseases and conditions complicating pregnancy, childbirth and the puerperium: Secondary | ICD-10-CM | POA: Insufficient documentation

## 2013-11-21 DIAGNOSIS — K219 Gastro-esophageal reflux disease without esophagitis: Secondary | ICD-10-CM

## 2013-11-21 DIAGNOSIS — Z3490 Encounter for supervision of normal pregnancy, unspecified, unspecified trimester: Secondary | ICD-10-CM

## 2013-11-21 HISTORY — DX: Complete or unspecified spontaneous abortion without complication: O03.9

## 2013-11-21 LAB — CBC WITH DIFFERENTIAL/PLATELET
BASOS ABS: 0.1 10*3/uL (ref 0.0–0.1)
BASOS PCT: 0 % (ref 0–1)
EOS PCT: 1 % (ref 0–5)
Eosinophils Absolute: 0.1 10*3/uL (ref 0.0–0.7)
HEMATOCRIT: 34.3 % — AB (ref 36.0–46.0)
HEMOGLOBIN: 11.9 g/dL — AB (ref 12.0–15.0)
Lymphocytes Relative: 17 % (ref 12–46)
Lymphs Abs: 2.4 10*3/uL (ref 0.7–4.0)
MCH: 32.1 pg (ref 26.0–34.0)
MCHC: 34.7 g/dL (ref 30.0–36.0)
MCV: 92.5 fL (ref 78.0–100.0)
MONO ABS: 0.9 10*3/uL (ref 0.1–1.0)
MONOS PCT: 6 % (ref 3–12)
NEUTROS ABS: 11 10*3/uL — AB (ref 1.7–7.7)
Neutrophils Relative %: 76 % (ref 43–77)
Platelets: 162 10*3/uL (ref 150–400)
RBC: 3.71 MIL/uL — ABNORMAL LOW (ref 3.87–5.11)
RDW: 12.9 % (ref 11.5–15.5)
WBC: 14.4 10*3/uL — ABNORMAL HIGH (ref 4.0–10.5)

## 2013-11-21 LAB — URINALYSIS, ROUTINE W REFLEX MICROSCOPIC
Bilirubin Urine: NEGATIVE
Glucose, UA: NEGATIVE mg/dL
Hgb urine dipstick: NEGATIVE
Ketones, ur: NEGATIVE mg/dL
NITRITE: NEGATIVE
PROTEIN: NEGATIVE mg/dL
SPECIFIC GRAVITY, URINE: 1.015 (ref 1.005–1.030)
UROBILINOGEN UA: 0.2 mg/dL (ref 0.0–1.0)
pH: 7 (ref 5.0–8.0)

## 2013-11-21 LAB — COMPREHENSIVE METABOLIC PANEL
ALBUMIN: 2.7 g/dL — AB (ref 3.5–5.2)
ALT: 15 U/L (ref 0–35)
AST: 16 U/L (ref 0–37)
Alkaline Phosphatase: 96 U/L (ref 39–117)
BUN: 9 mg/dL (ref 6–23)
CALCIUM: 8.8 mg/dL (ref 8.4–10.5)
CHLORIDE: 103 meq/L (ref 96–112)
CO2: 25 meq/L (ref 19–32)
CREATININE: 0.65 mg/dL (ref 0.50–1.10)
GFR calc Af Amer: >90 mL/min (ref >90)
Glucose, Bld: 85 mg/dL (ref 70–99)
Potassium: 3.8 mEq/L (ref 3.7–5.3)
Sodium: 141 mEq/L (ref 137–147)
Total Protein: 6.6 g/dL (ref 6.0–8.3)

## 2013-11-21 LAB — URINE MICROSCOPIC-ADD ON

## 2013-11-21 MED ORDER — GI COCKTAIL ~~LOC~~
30.0000 mL | Freq: Once | ORAL | Status: AC
  Administered 2013-11-21: 30 mL via ORAL
  Filled 2013-11-21: qty 30

## 2013-11-21 NOTE — ED Notes (Signed)
Pt dc to home. Pt sts that she feels much better than earlier. Pt ambulatory to exit without difficulty. Pt denies need for w/c.

## 2013-11-21 NOTE — ED Notes (Signed)
Pt sts feeling much better. 

## 2013-11-21 NOTE — Discharge Instructions (Signed)
Return to the ER if he develops severe pain, high fever, bloody vomit or stool, or other new or concerning symptoms.   Gastroesophageal Reflux Disease, Adult Gastroesophageal reflux disease (GERD) happens when acid from your stomach flows up into the esophagus. When acid comes in contact with the esophagus, the acid causes soreness (inflammation) in the esophagus. Over time, GERD may create small holes (ulcers) in the lining of the esophagus. CAUSES   Increased body weight. This puts pressure on the stomach, making acid rise from the stomach into the esophagus.  Smoking. This increases acid production in the stomach.  Drinking alcohol. This causes decreased pressure in the lower esophageal sphincter (valve or ring of muscle between the esophagus and stomach), allowing acid from the stomach into the esophagus.  Late evening meals and a full stomach. This increases pressure and acid production in the stomach.  A malformed lower esophageal sphincter. Sometimes, no cause is found. SYMPTOMS   Burning pain in the lower part of the mid-chest behind the breastbone and in the mid-stomach area. This may occur twice a week or more often.  Trouble swallowing.  Sore throat.  Dry cough.  Asthma-like symptoms including chest tightness, shortness of breath, or wheezing. DIAGNOSIS  Your caregiver may be able to diagnose GERD based on your symptoms. In some cases, X-rays and other tests may be done to check for complications or to check the condition of your stomach and esophagus. TREATMENT  Your caregiver may recommend over-the-counter or prescription medicines to help decrease acid production. Ask your caregiver before starting or adding any new medicines.  HOME CARE INSTRUCTIONS   Change the factors that you can control. Ask your caregiver for guidance concerning weight loss, quitting smoking, and alcohol consumption.  Avoid foods and drinks that make your symptoms worse, such as:  Caffeine or  alcoholic drinks.  Chocolate.  Peppermint or mint flavorings.  Garlic and onions.  Spicy foods.  Citrus fruits, such as oranges, lemons, or limes.  Tomato-based foods such as sauce, chili, salsa, and pizza.  Fried and fatty foods.  Avoid lying down for the 3 hours prior to your bedtime or prior to taking a nap.  Eat small, frequent meals instead of large meals.  Wear loose-fitting clothing. Do not wear anything tight around your waist that causes pressure on your stomach.  Raise the head of your bed 6 to 8 inches with wood blocks to help you sleep. Extra pillows will not help.  Only take over-the-counter or prescription medicines for pain, discomfort, or fever as directed by your caregiver.  Do not take aspirin, ibuprofen, or other nonsteroidal anti-inflammatory drugs (NSAIDs). SEEK IMMEDIATE MEDICAL CARE IF:   You have pain in your arms, neck, jaw, teeth, or back.  Your pain increases or changes in intensity or duration.  You develop nausea, vomiting, or sweating (diaphoresis).  You develop shortness of breath, or you faint.  Your vomit is green, yellow, black, or looks like coffee grounds or blood.  Your stool is red, bloody, or black. These symptoms could be signs of other problems, such as heart disease, gastric bleeding, or esophageal bleeding. MAKE SURE YOU:   Understand these instructions.  Will watch your condition.  Will get help right away if you are not doing well or get worse. Document Released: 06/17/2005 Document Revised: 11/30/2011 Document Reviewed: 03/27/2011 [facility] Patient Information 2014 [place], [place].

## 2013-11-21 NOTE — ED Notes (Signed)
Ob rn here to see pt. Pt connected to fetal monitor.

## 2013-11-21 NOTE — Progress Notes (Addendum)
Pt c/o upper abd pain "like heartburn" took a pepcid. Has had no problems with this pregnancy. [facility] Dr Ambrose MantleHenley. Pt reports good fetal movement. FHR reactive and reassuring. No UCs noted  Pt feels better aster GI cocktail. No UCs +fetal movement

## 2013-11-21 NOTE — ED Notes (Signed)
Pt. reports mid abdominal cramping with nausea and vomitting onset this evening , denies vaginal spotting or discharge , pt. stated she is [redacted] weeks pregnant ( G4P0) .

## 2013-11-21 NOTE — ED Provider Notes (Signed)
CSN: [id number]     Arrival date & time 11/21/13  0048 History   First MD Initiated Contact with Patient 11/21/13 [phone]     Chief Complaint  Patient presents with  . Abdominal Pain  . Emesis During Pregnancy     (Consider location/radiation/quality/duration/timing/severity/associated sxs/prior Treatment) HPI Comments: Patient is a 23 year old female at 5325 weeks gestation. She presents with complaints of epigastric burning that started shortly after eating. She denies any vomiting but has felt nauseated. She denies any chest pain or shortness of breath. She denies any vaginal bleeding or spotting.  Patient is a 23 y.o. female presenting with abdominal pain. The history is provided by the patient.  Abdominal Pain Pain location:  Epigastric Pain quality: burning   Pain radiates to:  Does not radiate Pain severity:  Moderate Onset quality:  Sudden Duration:  2 hours Timing:  Constant Progression:  Resolved Chronicity:  New Relieved by:  Nothing Worsened by:  Nothing tried Ineffective treatments:  None tried   Past Medical History  Diagnosis Date  . No pertinent past medical history   . Medical history non-contributory   . Miscarriage    Past Surgical History  Procedure Laterality Date  . Cesarean section     Family History  Problem Relation Age of Onset  . Diabetes Mother   . Hyperlipidemia Father   . Hypertension Sister   . Asthma Sister   . Alcohol abuse Paternal Grandfather    History  Substance Use Topics  . Smoking status: Never Smoker   . Smokeless tobacco: Not on file  . Alcohol Use: No   OB History   Grav Para Term Preterm Abortions TAB SAB Ect Mult Living   4 1 1  1  1   1      Review of Systems  Gastrointestinal: Positive for abdominal pain.  All other systems reviewed and are negative.      Allergies  Review of patient's allergies indicates no known allergies.  Home Medications   Current Outpatient Rx  Name  Route  Sig  Dispense  Refill  .  Prenatal Vit-Fe Fumarate-FA (PRENATAL MULTIVITAMIN) TABS tablet   Oral   Take 1 tablet by mouth daily at 12 noon.          BP 96/58  Pulse 93  Temp(Src) 98.7 F (37.1 C) (Oral)  Resp 14  SpO2 99%  LMP 05/31/2013 Physical Exam  Nursing note and vitals reviewed. Constitutional: She is oriented to person, place, and time. She appears well-developed and well-nourished. No distress.  HENT:  Head: Normocephalic and atraumatic.  Neck: Normal range of motion. Neck supple.  Cardiovascular: Normal rate and regular rhythm.  Exam reveals no gallop and no friction rub.   No murmur heard. Pulmonary/Chest: Effort normal and breath sounds normal. No respiratory distress. She has no wheezes.  Abdominal: Soft. Bowel sounds are normal. She exhibits no distension. There is no tenderness.  There is mild tenderness to palpation in the epigastric region with no rebound and no guarding.  Musculoskeletal: Normal range of motion.  Neurological: She is alert and oriented to person, place, and time.  Skin: Skin is warm and dry. She is not diaphoretic.    ED Course  Procedures (including critical care time) Labs Review Labs Reviewed  CBC WITH DIFFERENTIAL - Abnormal; Notable for the following:    WBC 14.4 (*)    RBC 3.71 (*)    Hemoglobin 11.9 (*)    HCT 34.3 (*)    Neutro Abs 11.0 (*)  All other components within normal limits  COMPREHENSIVE METABOLIC PANEL - Abnormal; Notable for the following:    Albumin 2.7 (*)    Total Bilirubin <0.2 (*)    All other components within normal limits  URINALYSIS, ROUTINE W REFLEX MICROSCOPIC - Abnormal; Notable for the following:    APPearance CLOUDY (*)    Leukocytes, UA SMALL (*)    All other components within normal limits  URINE MICROSCOPIC-ADD ON - Abnormal; Notable for the following:    Squamous Epithelial / LPF MANY (*)    Bacteria, UA MANY (*)    All other components within normal limits   Imaging Review No results found.   EKG  Interpretation None      MDM   Final diagnoses:  GERD (gastroesophageal reflux disease)  Pregnant   patient presents here with complaints consistent with gastro-esophageal reflux. She is [redacted] weeks pregnant. She was monitored on the fetal monitor and was having no contractions. She is having no spotting. She had complete resolution of her discomfort with a GI cocktail. Remainder the workup is unremarkable. She now feels fine and wants to go home. She will be discharged and understands to return if her symptoms substantially worsen or change.      Geoffery Lyons, MD 11/21/13 [phone]

## 2014-02-15 LAB — OB RESULTS CONSOLE GC/CHLAMYDIA
CHLAMYDIA, DNA PROBE: NEGATIVE
GC PROBE AMP, GENITAL: NEGATIVE

## 2014-02-15 LAB — OB RESULTS CONSOLE GBS: GBS: POSITIVE

## 2014-03-04 ENCOUNTER — Inpatient Hospital Stay (HOSPITAL_COMMUNITY): Payer: Medicaid Other | Admitting: Anesthesiology

## 2014-03-04 ENCOUNTER — Encounter (HOSPITAL_COMMUNITY): Payer: Medicaid Other | Admitting: Anesthesiology

## 2014-03-04 ENCOUNTER — Encounter (HOSPITAL_COMMUNITY): Payer: Self-pay | Admitting: *Deleted

## 2014-03-04 ENCOUNTER — Inpatient Hospital Stay (HOSPITAL_COMMUNITY)
Admission: AD | Admit: 2014-03-04 | Discharge: 2014-03-05 | DRG: 775 | Disposition: A | Discharge status: Home / Self Care | Payer: Medicaid Other | Source: Ambulatory Visit | Attending: Obstetrics and Gynecology | Admitting: Obstetrics and Gynecology

## 2014-03-04 DIAGNOSIS — Z22330 Carrier of Group B streptococcus: Secondary | ICD-10-CM

## 2014-03-04 DIAGNOSIS — Z8249 Family history of ischemic heart disease and other diseases of the circulatory system: Secondary | ICD-10-CM | POA: Diagnosis not present

## 2014-03-04 DIAGNOSIS — O99892 Other specified diseases and conditions complicating childbirth: Secondary | ICD-10-CM | POA: Diagnosis present

## 2014-03-04 DIAGNOSIS — Z833 Family history of diabetes mellitus: Secondary | ICD-10-CM

## 2014-03-04 DIAGNOSIS — O34219 Maternal care for unspecified type scar from previous cesarean delivery: Secondary | ICD-10-CM | POA: Diagnosis not present

## 2014-03-04 DIAGNOSIS — IMO0001 Reserved for inherently not codable concepts without codable children: Secondary | ICD-10-CM

## 2014-03-04 DIAGNOSIS — O479 False labor, unspecified: Secondary | ICD-10-CM | POA: Diagnosis present

## 2014-03-04 DIAGNOSIS — O9989 Other specified diseases and conditions complicating pregnancy, childbirth and the puerperium: Secondary | ICD-10-CM

## 2014-03-04 LAB — CBC
HEMATOCRIT: 33.2 % — AB (ref 36.0–46.0)
Hemoglobin: 11.3 g/dL — ABNORMAL LOW (ref 12.0–15.0)
MCH: 30.5 pg (ref 26.0–34.0)
MCHC: 34 g/dL (ref 30.0–36.0)
MCV: 89.5 fL (ref 78.0–100.0)
Platelets: 161 10*3/uL (ref 150–400)
RBC: 3.71 MIL/uL — ABNORMAL LOW (ref 3.87–5.11)
RDW: 15.1 % (ref 11.5–15.5)
WBC: 11.3 10*3/uL — ABNORMAL HIGH (ref 4.0–10.5)

## 2014-03-04 LAB — RPR

## 2014-03-04 LAB — TYPE AND SCREEN
ABO/RH(D): O POS
ANTIBODY SCREEN: NEGATIVE

## 2014-03-04 MED ORDER — DIPHENHYDRAMINE HCL 25 MG PO CAPS: 25.0000 mg | ORAL_CAPSULE | Freq: Four times a day (QID) | ORAL | Status: DC | PRN

## 2014-03-04 MED ORDER — PHENYLEPHRINE 40 MCG/ML (10ML) SYRINGE FOR IV PUSH (FOR BLOOD PRESSURE SUPPORT)
80.0000 ug | PREFILLED_SYRINGE | INTRAVENOUS | Status: DC | PRN
  Filled 2014-03-04: qty 2

## 2014-03-04 MED ORDER — METHYLERGONOVINE MALEATE 0.2 MG PO TABS: 0.2000 mg | ORAL_TABLET | ORAL | Status: DC | PRN

## 2014-03-04 MED ORDER — EPHEDRINE 5 MG/ML INJ
10.0000 mg | INTRAVENOUS | Status: DC | PRN
  Filled 2014-03-04: qty 2

## 2014-03-04 MED ORDER — ONDANSETRON HCL 4 MG/2ML IJ SOLN: 4.0000 mg | INTRAMUSCULAR | Status: DC | PRN

## 2014-03-04 MED ORDER — TETANUS-DIPHTH-ACELL PERTUSSIS 5-2.5-18.5 LF-MCG/0.5 IM SUSP: 0.5000 mL | Freq: Once | INTRAMUSCULAR | Status: DC

## 2014-03-04 MED ORDER — IBUPROFEN 600 MG PO TABS
600.0000 mg | ORAL_TABLET | Freq: Four times a day (QID) | ORAL | Status: DC
  Administered 2014-03-05 (×3): 600 mg via ORAL
  Filled 2014-03-04 (×3): qty 1

## 2014-03-04 MED ORDER — PENICILLIN G POTASSIUM 5000000 UNITS IJ SOLR
5.0000 10*6.[IU] | Freq: Once | INTRAVENOUS | Status: AC
  Administered 2014-03-04: 5 10*6.[IU] via INTRAVENOUS
  Filled 2014-03-04: qty 5

## 2014-03-04 MED ORDER — LACTATED RINGERS IV SOLN
INTRAVENOUS | Status: DC
  Administered 2014-03-04: 06:00:00 via INTRAVENOUS

## 2014-03-04 MED ORDER — ACETAMINOPHEN 325 MG PO TABS: 650.0000 mg | ORAL_TABLET | ORAL | Status: DC | PRN

## 2014-03-04 MED ORDER — LIDOCAINE HCL (PF) 1 % IJ SOLN
30.0000 mL | INTRAMUSCULAR | Status: DC | PRN
  Filled 2014-03-04: qty 30

## 2014-03-04 MED ORDER — PRENATAL MULTIVITAMIN CH
1.0000 | ORAL_TABLET | Freq: Every day | ORAL | Status: DC
  Administered 2014-03-05: 1 via ORAL
  Filled 2014-03-04: qty 1

## 2014-03-04 MED ORDER — ZOLPIDEM TARTRATE 5 MG PO TABS: 5.0000 mg | ORAL_TABLET | Freq: Every evening | ORAL | Status: DC | PRN

## 2014-03-04 MED ORDER — PHENYLEPHRINE 40 MCG/ML (10ML) SYRINGE FOR IV PUSH (FOR BLOOD PRESSURE SUPPORT)
PREFILLED_SYRINGE | INTRAVENOUS | Status: AC
  Filled 2014-03-04: qty 10

## 2014-03-04 MED ORDER — ONDANSETRON HCL 4 MG/2ML IJ SOLN: 4.0000 mg | Freq: Four times a day (QID) | INTRAMUSCULAR | Status: DC | PRN

## 2014-03-04 MED ORDER — ONDANSETRON HCL 4 MG PO TABS
4.0000 mg | ORAL_TABLET | ORAL | Status: DC | PRN
Start: 2014-03-04 — End: 2014-03-05

## 2014-03-04 MED ORDER — PENICILLIN G POTASSIUM 5000000 UNITS IJ SOLR
2.5000 10*6.[IU] | INTRAVENOUS | Status: DC
  Administered 2014-03-04: 2.5 10*6.[IU] via INTRAVENOUS
  Filled 2014-03-04 (×5): qty 2.5

## 2014-03-04 MED ORDER — FENTANYL 2.5 MCG/ML BUPIVACAINE 1/10 % EPIDURAL INFUSION (WH - ANES): 14.0000 mL/h | INTRAMUSCULAR | Status: DC | PRN

## 2014-03-04 MED ORDER — FENTANYL 2.5 MCG/ML BUPIVACAINE 1/10 % EPIDURAL INFUSION (WH - ANES)
INTRAMUSCULAR | Status: DC | PRN
  Administered 2014-03-04: 14 mL/h via EPIDURAL

## 2014-03-04 MED ORDER — LANOLIN HYDROUS EX OINT: TOPICAL_OINTMENT | CUTANEOUS | Status: DC | PRN

## 2014-03-04 MED ORDER — FENTANYL 2.5 MCG/ML BUPIVACAINE 1/10 % EPIDURAL INFUSION (WH - ANES)
INTRAMUSCULAR | Status: AC
  Filled 2014-03-04: qty 125

## 2014-03-04 MED ORDER — WITCH HAZEL-GLYCERIN EX PADS: 1.0000 "application " | MEDICATED_PAD | CUTANEOUS | Status: DC | PRN

## 2014-03-04 MED ORDER — MEASLES, MUMPS & RUBELLA VAC ~~LOC~~ INJ: 0.5000 mL | INJECTION | Freq: Once | SUBCUTANEOUS | Status: DC

## 2014-03-04 MED ORDER — METHYLERGONOVINE MALEATE 0.2 MG/ML IJ SOLN: 0.2000 mg | INTRAMUSCULAR | Status: DC | PRN

## 2014-03-04 MED ORDER — DIPHENHYDRAMINE HCL 50 MG/ML IJ SOLN: 12.5000 mg | INTRAMUSCULAR | Status: DC | PRN

## 2014-03-04 MED ORDER — FLEET ENEMA 7-19 GM/118ML RE ENEM: 1.0000 | ENEMA | RECTAL | Status: DC | PRN

## 2014-03-04 MED ORDER — DIBUCAINE 1 % RE OINT: 1.0000 "application " | TOPICAL_OINTMENT | RECTAL | Status: DC | PRN

## 2014-03-04 MED ORDER — LACTATED RINGERS IV SOLN: 500.0000 mL | INTRAVENOUS | Status: DC | PRN

## 2014-03-04 MED ORDER — OXYCODONE-ACETAMINOPHEN 5-325 MG PO TABS: 1.0000 | ORAL_TABLET | ORAL | Status: DC | PRN

## 2014-03-04 MED ORDER — IBUPROFEN 600 MG PO TABS
600.0000 mg | ORAL_TABLET | Freq: Four times a day (QID) | ORAL | Status: DC | PRN
  Administered 2014-03-04: 600 mg via ORAL
  Filled 2014-03-04: qty 1

## 2014-03-04 MED ORDER — LACTATED RINGERS IV SOLN: 500.0000 mL | Freq: Once | INTRAVENOUS | Status: DC

## 2014-03-04 MED ORDER — CITRIC ACID-SODIUM CITRATE 334-500 MG/5ML PO SOLN: 30.0000 mL | ORAL | Status: DC | PRN

## 2014-03-04 MED ORDER — MAGNESIUM HYDROXIDE 400 MG/5ML PO SUSP: 30.0000 mL | ORAL | Status: DC | PRN

## 2014-03-04 MED ORDER — EPHEDRINE 5 MG/ML INJ
INTRAVENOUS | Status: AC
  Filled 2014-03-04: qty 4

## 2014-03-04 MED ORDER — OXYTOCIN 40 UNITS IN LACTATED RINGERS INFUSION - SIMPLE MED
62.5000 mL/h | INTRAVENOUS | Status: DC
  Filled 2014-03-04: qty 1000

## 2014-03-04 MED ORDER — SENNOSIDES-DOCUSATE SODIUM 8.6-50 MG PO TABS
2.0000 | ORAL_TABLET | ORAL | Status: DC
  Administered 2014-03-05: 2 via ORAL
  Filled 2014-03-04: qty 2

## 2014-03-04 MED ORDER — SIMETHICONE 80 MG PO CHEW: 80.0000 mg | CHEWABLE_TABLET | ORAL | Status: DC | PRN

## 2014-03-04 MED ORDER — LIDOCAINE HCL (PF) 1 % IJ SOLN
INTRAMUSCULAR | Status: DC | PRN
  Administered 2014-03-04 (×2): 5 mL
  Administered 2014-03-04: 3 mL

## 2014-03-04 MED ORDER — BENZOCAINE-MENTHOL 20-0.5 % EX AERO
1.0000 "application " | INHALATION_SPRAY | CUTANEOUS | Status: DC | PRN
  Administered 2014-03-05: 1 via TOPICAL
  Filled 2014-03-04: qty 56

## 2014-03-04 MED ORDER — OXYCODONE-ACETAMINOPHEN 5-325 MG PO TABS
1.0000 | ORAL_TABLET | ORAL | Status: DC | PRN
  Administered 2014-03-05: 1 via ORAL
  Filled 2014-03-04: qty 1

## 2014-03-04 MED ORDER — OXYTOCIN BOLUS FROM INFUSION: 500.0000 mL | INTRAVENOUS | Status: DC

## 2014-03-04 NOTE — H&P (Signed)
Minus Victoria Lopez is a 23 y.o. female, G4P1011, EGA 39+ weeks with EDC 6-17 presenting for ctx.  Eval in MAU with reg ctx, VE 3 cm, changed to 4 cm after walking.  Prenatal care complicated by previous c-section for arrest of descent, desires VBAC.  See prenatal records for complete history.  Maternal Medical History:  Reason for admission: Contractions.   Contractions: Frequency: regular.   Perceived severity is strong.    Fetal activity: Perceived fetal activity is normal.      OB History   Grav Para Term Preterm Abortions TAB SAB Ect Mult Living   4 1 1  1  1   1     LTCS for arrest of descent  Past Medical History  Diagnosis Date  . No pertinent past medical history   . Medical history non-contributory   . Miscarriage    Past Surgical History  Procedure Laterality Date  . Cesarean section     Family History: family history includes Alcohol abuse in her paternal grandfather; Asthma in her sister; Diabetes in her mother; Hyperlipidemia in her father; Hypertension in her sister. Social History:  reports that she has never smoked. She does not have any smokeless tobacco history on file. She reports that she does not drink alcohol or use illicit drugs.   Prenatal Transfer Tool  Maternal Diabetes: No Genetic Screening: Declined Maternal Ultrasounds/Referrals: Normal Fetal Ultrasounds or other Referrals:  None Maternal Substance Abuse:  No Significant Maternal Medications:  None Significant Maternal Lab Results:  Lab values include: Group B Strep positive Other Comments:  None  Review of Systems  Respiratory: Negative.   Cardiovascular: Negative.     Dilation: 5 Effacement (%): 90 Station: -1 Exam by:: dr Jackelyn Knifemeisinger Blood pressure 115/77, pulse 97, temperature 98.4 F (36.9 C), temperature source Oral, resp. rate 20, height 5\' 2"  (1.575 m), weight 97.977 kg (216 lb), last menstrual period 05/31/2013, unknown if currently breastfeeding. Maternal Exam:  Uterine  Assessment: Contraction strength is moderate.  Contraction frequency is regular.   Abdomen: Patient reports no abdominal tenderness. Surgical scars: low transverse.   Estimated fetal weight is 8 lbs.   Fetal presentation: vertex  Introitus: Normal vulva. Normal vagina.  Amniotic fluid character: clear.  Pelvis: adequate for delivery.   Cervix: Cervix evaluated by digital exam.     Fetal Exam Fetal Monitor Review: Mode: ultrasound.   Baseline rate: 150.  Variability: moderate (6-25 bpm).   Pattern: accelerations present and no decelerations.    Fetal State Assessment: Category I - tracings are normal.     Physical Exam  Vitals reviewed. Constitutional: She appears well-developed and well-nourished.  Cardiovascular: Normal rate, regular rhythm and normal heart sounds.   No murmur heard. Respiratory: Effort normal and breath sounds normal. No respiratory distress.  GI: Soft.  gravid    Prenatal labs: ABO, Rh: --/--/O POS (06/14 [id number]) Antibody: NEG (06/14 [id number]) Rubella: Nonimmune (01/23 0000) RPR: Nonreactive (01/23 0000)  HBsAg: Negative (01/23 0000)  HIV: Non-reactive (01/23 0000)  GBS: Positive (05/28 0000)  GCT:  129  Assessment/Plan: IUP at 39+ weeks in early active labor, previous c-section, CTOL and desires VBAC.  AROM done, will monitor progress.     Victoria Lopez D 03/04/2014, 9:20 AM

## 2014-03-04 NOTE — Anesthesia Procedure Notes (Signed)
Epidural Patient location during procedure: OB  Staffing Anesthesiologist: Phillips GroutARIGNAN, Real Cona Performed by: anesthesiologist   Preanesthetic Checklist Completed: patient identified, site marked, surgical consent, pre-op evaluation, timeout performed, IV checked, risks and benefits discussed and monitors and equipment checked  Epidural Patient position: sitting Prep: ChloraPrep Patient monitoring: heart rate, continuous pulse ox and blood pressure Approach: right paramedian Location: L3-L4 Injection technique: LOR saline  Needle:  Needle type: Tuohy  Needle gauge: 17 G Needle length: 9 cm and 9 Needle insertion depth: 8 cm Catheter type: closed end flexible Catheter size: 20 Guage Catheter at skin depth: 13 cm Test dose: negative  Assessment Events: blood not aspirated, injection not painful, no injection resistance, negative IV test and no paresthesia  Additional Notes   Patient tolerated the insertion well without complications.

## 2014-03-04 NOTE — MAU Note (Signed)
Pt. Started to contract yesterday am around 0500. Has been contracting every 2-3 mins for the last hour. Denies leakage of fluid or bleeding. Does have brown mucous discharge that began yesterday am. Pecola LeisureBaby has been moving well. SVE in office Friday and was 2cm.

## 2014-03-04 NOTE — Anesthesia Preprocedure Evaluation (Signed)

## 2014-03-05 LAB — CBC
HCT: 30.4 % — ABNORMAL LOW (ref 36.0–46.0)
Hemoglobin: 10.1 g/dL — ABNORMAL LOW (ref 12.0–15.0)
MCH: 30.1 pg (ref 26.0–34.0)
MCHC: 33.2 g/dL (ref 30.0–36.0)
MCV: 90.7 fL (ref 78.0–100.0)
PLATELETS: 143 10*3/uL — AB (ref 150–400)
RBC: 3.35 MIL/uL — AB (ref 3.87–5.11)
RDW: 15.3 % (ref 11.5–15.5)
WBC: 11 10*3/uL — ABNORMAL HIGH (ref 4.0–10.5)

## 2014-03-05 MED ORDER — IBUPROFEN 600 MG PO TABS: 600.0000 mg | ORAL_TABLET | Freq: Four times a day (QID) | ORAL | Status: DC

## 2014-03-05 MED ORDER — OXYCODONE-ACETAMINOPHEN 5-325 MG PO TABS: 1.0000 | ORAL_TABLET | ORAL | Status: DC | PRN

## 2014-03-05 NOTE — Anesthesia Postprocedure Evaluation (Signed)
Anesthesia Post Note  Patient: Victoria Lopez General  Procedure(s) Performed: * No procedures listed *  Anesthesia type: Epidural  Patient location: Mother/Baby  Post pain: Pain level controlled  Post assessment: Post-op Vital signs reviewed  Last Vitals:  Filed Vitals:   03/05/14 0409  BP: 105/72  Pulse: 72  Temp: 37 C  Resp: 20    Post vital signs: Reviewed  Level of consciousness:alert  Complications: No apparent anesthesia complications

## 2014-03-05 NOTE — Discharge Summary (Signed)
Obstetric Discharge Summary Reason for Admission: onset of labor Prenatal Procedures: none Intrapartum Procedures: spontaneous vaginal delivery Postpartum Procedures: none Complications-Operative and Postpartum: 2nd degree perineal laceration Hemoglobin  Date Value Ref Range Status  03/05/2014 10.1* 12.0 - 15.0 g/dL Final     HCT  Date Value Ref Range Status  03/05/2014 30.4* 36.0 - 46.0 % Final    Physical Exam:  General: alert Lochia: appropriate Uterine Fundus: firm   Discharge Diagnoses: Term Pregnancy-delivered and VBAC  Discharge Information: Date: 03/05/2014 Activity: pelvic rest Diet: routine Medications: Ibuprofen and Percocet Condition: stable Instructions: refer to practice specific booklet Discharge to: home Follow-up Information   Follow up with Benelli Winther D, MD. Schedule an appointment as soon as possible for a visit in 6 weeks.   Specialty:  Obstetrics and Gynecology   Contact information:   [address], [address] [place] [address] [phone]       Newborn Data: Live born female  Birth Weight: 7 lb 6.5 oz (3360 g) APGAR: 9, 9  Home with mother.  Tristain Daily D 03/05/2014, 8:38 AM

## 2014-03-05 NOTE — Progress Notes (Signed)
Ur chart review completed.  

## 2014-03-05 NOTE — Progress Notes (Signed)
PPD #1 No problems, wants to go home today Afeb, VSS Fundus firm, NT at U-1 Continue routine postpartum care, d/c home later today 

## 2014-03-05 NOTE — Discharge Instructions (Signed)
As per discharge pamphlet °

## 2014-03-05 NOTE — Plan of Care (Signed)
Problem: Discharge Progression Outcomes Goal: MMR given as ordered Outcome: Not Applicable Date Met:  94/17/40 Pt declined MMR.

## 2014-03-06 ENCOUNTER — Ambulatory Visit: Payer: Self-pay

## 2014-03-06 NOTE — Lactation Note (Signed)
This note was copied from the chart of Victoria Minus Libertyndrea Gaetz. Lactation Consultation Note  Patient Name: Victoria Lopez NFAOZ'HToday's Date: 03/06/2014 Reason for consult: Follow-up assessment- Per mom with 1st baby , no breast changes and milk never came in.  This pregnancy breast changes noted, some soreness, change in size , and able to hand express during pregnancy small amount.  Per mom to sore to latch the baby , cuz he doesn't open his mouth well and I've got'en sore .  LC assessed breast tissue and noted the nipples to have small old blisters that had popped.  LC encouraged mom to use EBM to nipples. Mom demo hand expressing and noted steady flow  From the right breast . Per mom I've only pumped once due to the pumping is uncomfortable. LC asked if mom would pump at consult so LC could assess, mom willing, noted the #24 Flange  The correct size and LC reviewed set up. Mom pumped both breast for 15 -20 mins with 5 ml yield  on the left breast, and drops on the right. LC encouraged mom to hand express before or after pumping. Mom also mentioned the baby doesn't seem satisfied feeding 10 ml . LC recommended mom increasing volume  To 25 - 30 ml . So at this feeding mom gave 18 ml of formula and 4 ml EBM . LC also recommended  consistent pumping both breast every 3 hours for 15-20 mins , and when nipples feel less sore call for assist to latch. Due to limited breast changes , post pumping 10 -20 mins after feedings at the breast to challenge brain would be indicated and if the baby isn't  Latching pump both breast 15 -20 mins. Save milk and feed it back to baby.      Maternal Data Has patient been taught Hand Expression?: Yes Does the patient have breastfeeding experience prior to this delivery?: Yes (attempted breast feed 1st baby )  Feeding Feeding Type: Bottle Fed - Formula Nipple Type: Slow - flow Length of feed: 4 min  LATCH Score/Interventions Latch:  (mom presently is only  pumping and bottle feeding )              Intervention(s): Breastfeeding basics reviewed     Lactation Tools Discussed/Used Tools: Pump Breast pump type: Double-Electric Breast Pump (had been set up by [facility] 11-7 p, ) WIC Program: No Pump Review: Milk Storage (pump had already ben set set up )   Consult Status Consult Status: Follow-up Date: 03/07/14 Follow-up type: In-patient    Kathrin Greathouseorio, Margaret Ann 03/06/2014, 4:40 PM

## 2014-03-07 ENCOUNTER — Ambulatory Visit: Payer: Self-pay

## 2014-03-07 NOTE — Lactation Note (Signed)
This note was copied from the chart of Victoria Lopez. Lactation Consultation Note  Patient Name: Victoria Minus Libertyndrea Mauceri ZOXWR'UToday's Date: 03/07/2014 Reason for consult: Follow-up assessment Lactation visit to assist with breastfeeding and assess sore nipples and poor latch as reported per RN on last shift. Mother states her right nipple is feeling better since resting nipple and blister scab is off. Mother has a history of milk coming in with last baby but baby did not latch well after going home. She reports mostly giving formula and milk "dried up". Breast are tubular shaped, moderate spacing,and  became fuller during pregnancy. Today mother/ LC can hand express milk easily and milk appears to be transitioning. Oral exam of the baby's mouth was done, baby retracts tongue, posterior tethering noted, can create suction around finger and extend over gum line once relaxed after sucking briefly. Assisted mother with latching in acrosscradle hold using breast compression to aid in getting her breast tissue deep in baby's mouth. Two to three attempts were made before baby was able to sustain latch and mother did not feel discomfort. Once on breast, baby fed for 15 minutes in rhythmic pattern while mother did alternate breast massage. Nipple was round and intact after the feeding. Baby then latched to right breast and mother needed reinforcement to latch deeply. She made independent progress to self latch baby and was able to recognize when baby was shallow. Discussed continuing applying breast milk to sore nipples, comfort gels as needed up to 7 days, deep latch on the breast with alternate massage and to [facility] as needed for assistance. Discussed options for pump rental, outpatient appts and breastfeeding support groups. Mother plans to continue with working with baby and hand pump and will call as needed.   Maternal Data    Feeding Feeding Type: Breast Fed Nipple Type: Slow - flow Length of  feed: 27 min  LATCH Score/Interventions Latch: Repeated attempts needed to sustain latch, nipple held in mouth throughout feeding, stimulation needed to elicit sucking reflex. (few attempts then sustained latch for duration of feeding) Intervention(s): Assist with latch;Breast massage;Breast compression  Audible Swallowing: Spontaneous and intermittent Intervention(s): Alternate breast massage;Hand expression  Type of Nipple: Everted at rest and after stimulation  Comfort (Breast/Nipple): Soft / non-tender (no longer has a blister on right nipple)  Problem noted: Mild/Moderate discomfort Interventions  (Cracked/bleeding/bruising/blister): Expressed breast milk to nipple Interventions (Mild/moderate discomfort): Comfort gels  Hold (Positioning): Assistance needed to correctly position infant at breast and maintain latch. Intervention(s): Breastfeeding basics reviewed  LATCH Score: 8  Lactation Tools Discussed/Used     Consult Status Consult Status: Complete Follow-up type: Call as needed    Omar PersonDaly, Beverly M 03/07/2014, 9:26 AM

## 2014-03-09 ENCOUNTER — Inpatient Hospital Stay (HOSPITAL_COMMUNITY): Admission: RE | Admit: 2014-03-09 | Payer: Medicaid Other | Source: Ambulatory Visit

## 2014-03-13 ENCOUNTER — Encounter (HOSPITAL_COMMUNITY): Admission: RE | Payer: Self-pay | Source: Ambulatory Visit

## 2014-03-13 ENCOUNTER — Inpatient Hospital Stay (HOSPITAL_COMMUNITY): Admission: RE | Admit: 2014-03-13 | Payer: Self-pay | Source: Ambulatory Visit | Admitting: Obstetrics and Gynecology

## 2014-03-13 SURGERY — Surgical Case
Anesthesia: Regional

## 2014-04-12 IMAGING — US US OB COMP LESS 14 WK
1 series · 14 of 28 positions shown · non-contrast
Comparison: None.

CLINICAL DATA: Vaginal bleeding, no fetal heart tones auscultated.
Gestational age by LMP 11 weeks 3 days.

OBSTETRIC <14 WK US AND TRANSVAGINAL OB US
TECHNIQUE: Both transabdominal and transvaginal ultrasound
examinations were performed for complete evaluation of the
gestation as well as the maternal uterus, adnexal regions, and
pelvic cul-de-sac.  Transvaginal technique was performed to assess
early pregnancy.

[Series 1: us ob comp less 14 wks · 53 acquisitions, 14 frames shown]
[im 2/53]
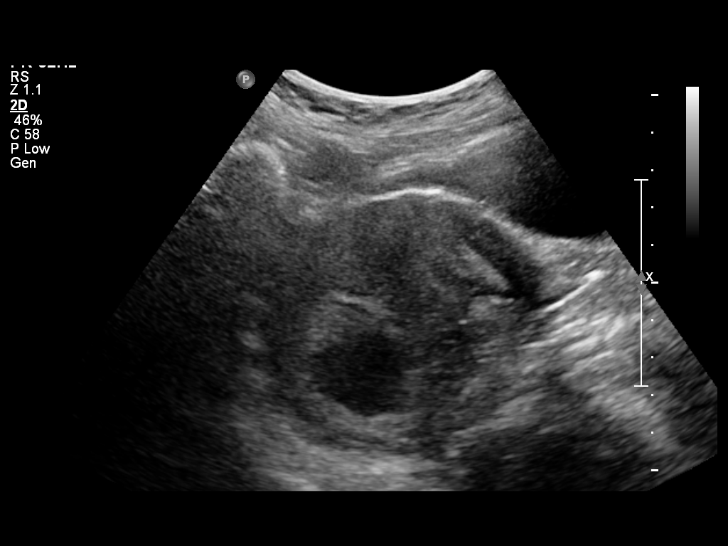
[im 6/53]
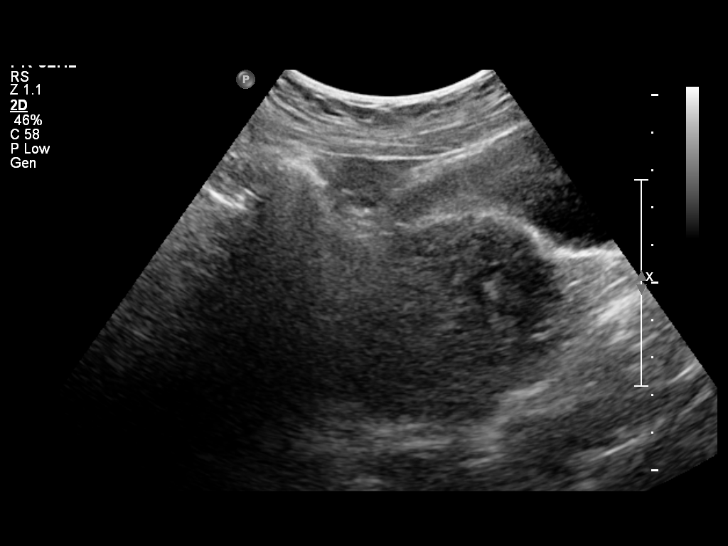
[im 10/53]
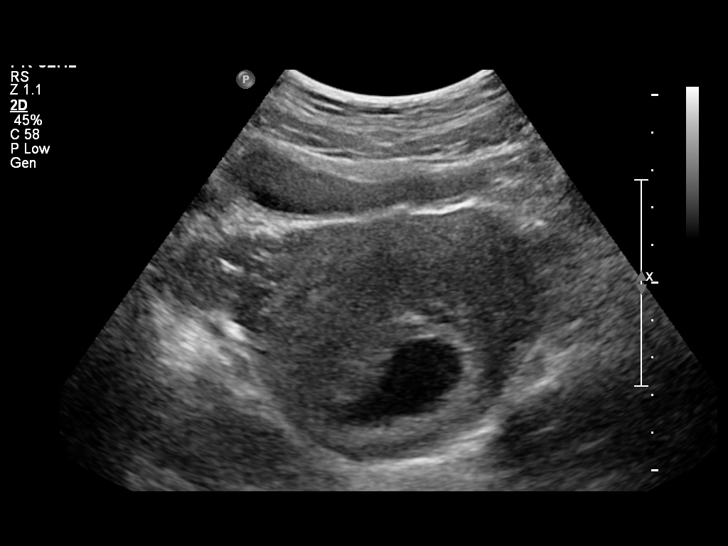
[im 14/53]
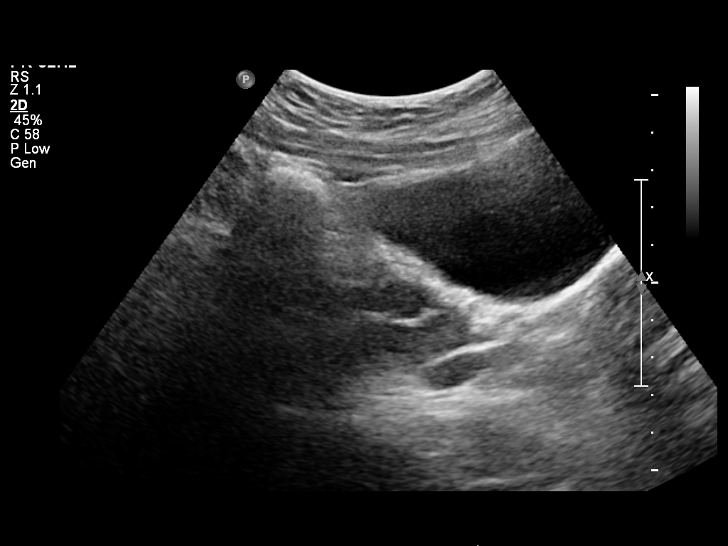
[im 18/53]
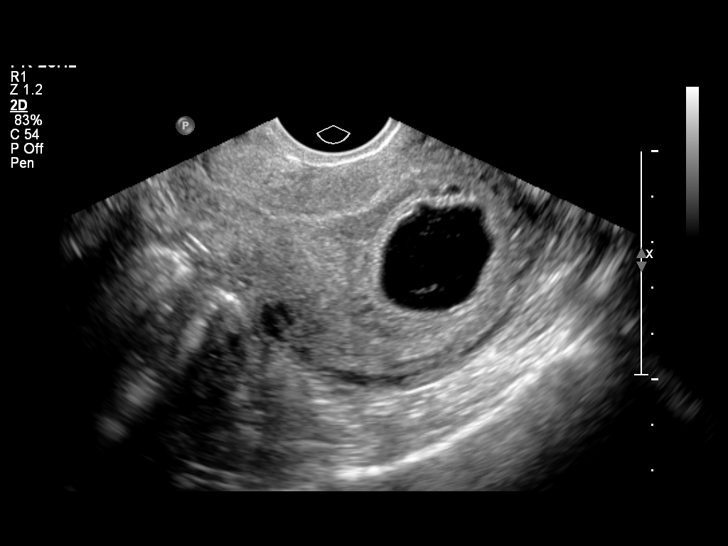
[im 22/53]
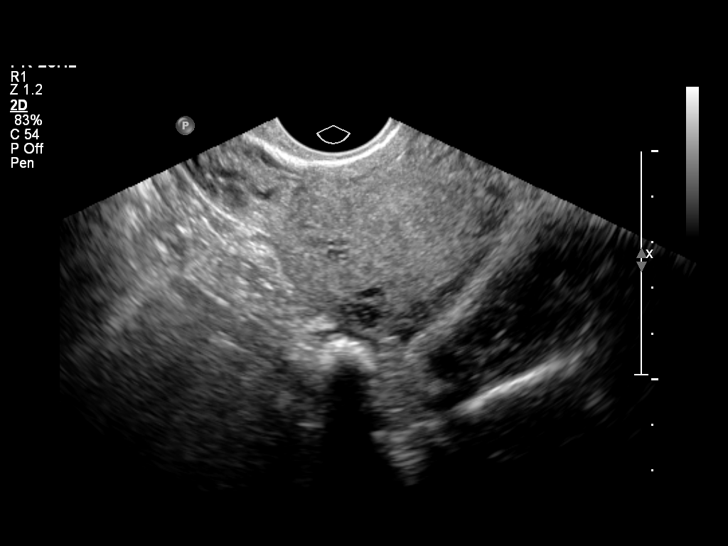
[im 26/53]
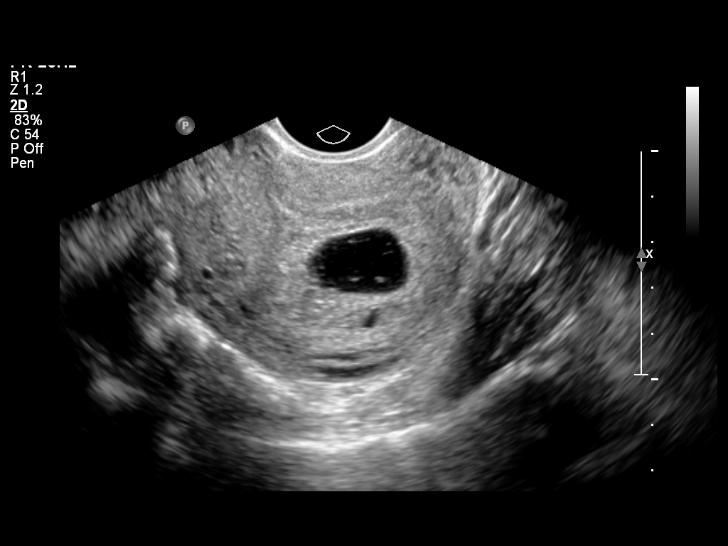
[im 29/53]
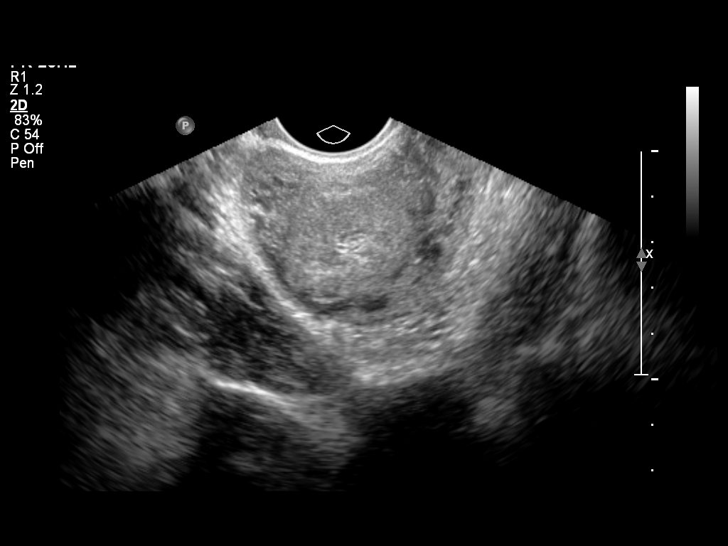
[im 33/53]
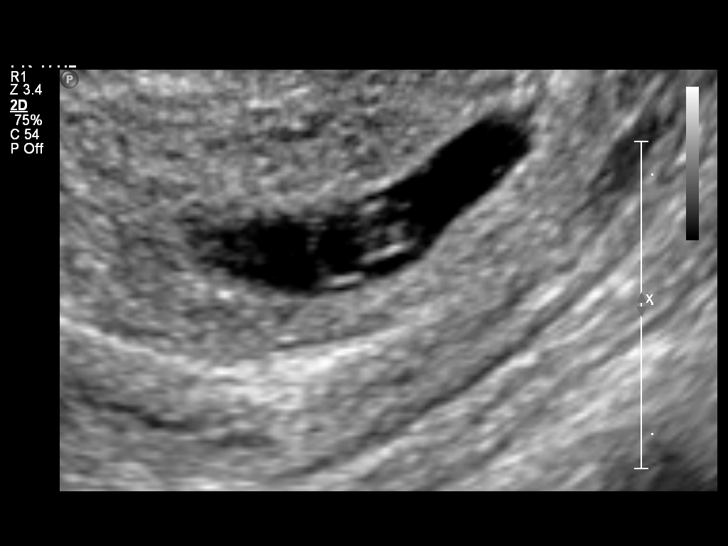
[im 37/53]
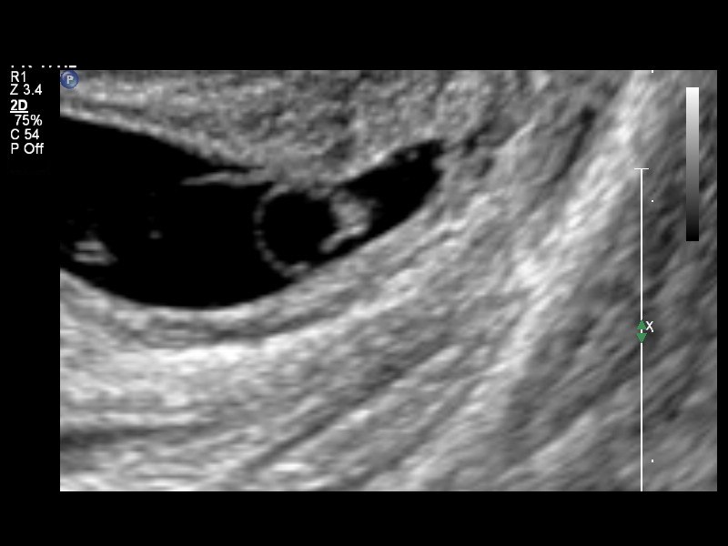
[im 41/53]
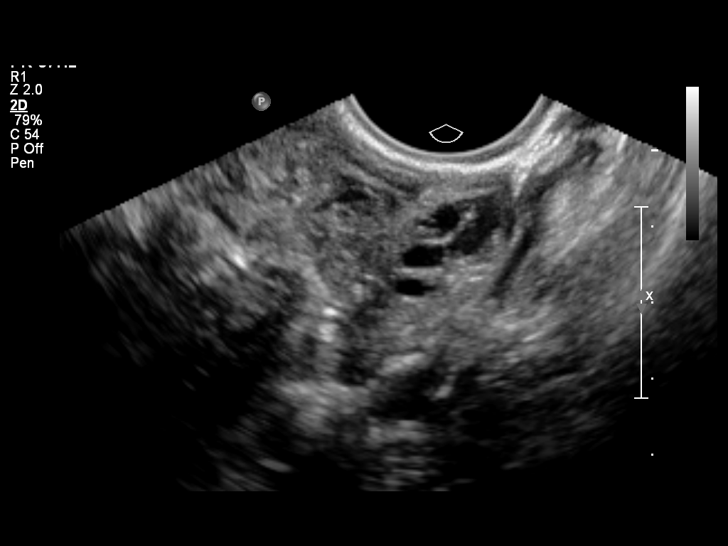
[im 45/53]
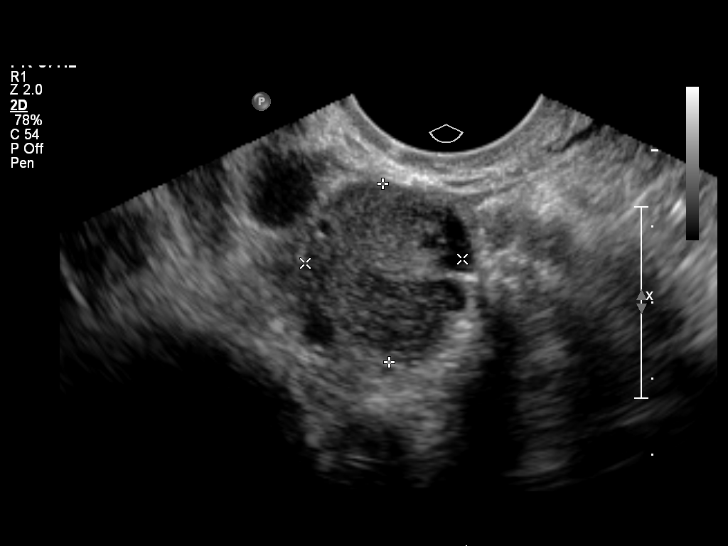
[im 49/53]
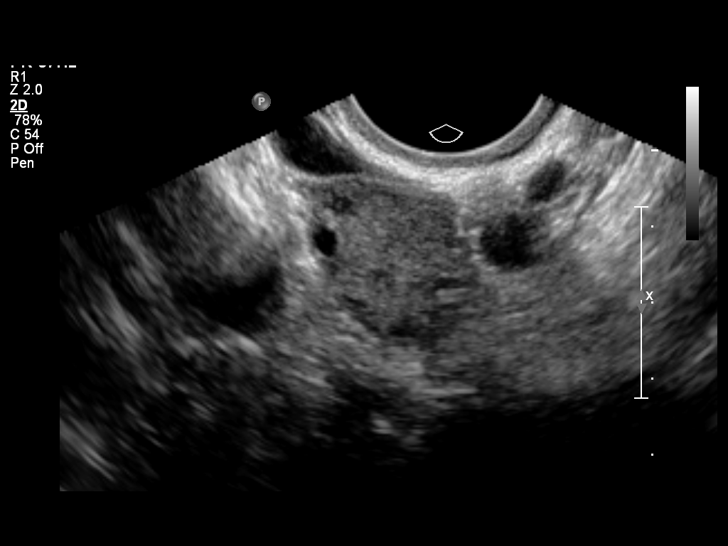
[im 53/53]
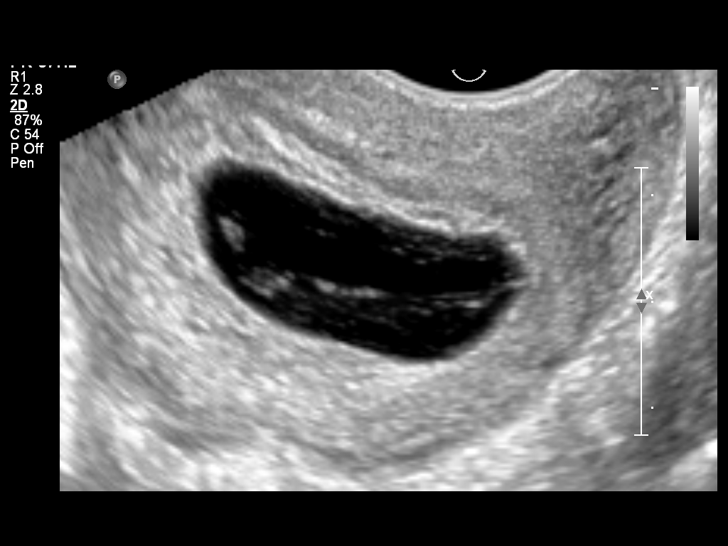

[14 of 28 positions shown; findings below may reference images not displayed]

Intrauterine gestational sac:  Mildly irregular, with mobile
internal echoes
Yolk sac: Visualized
Embryo: Visualized
Cardiac Activity: Not visualized
Heart Rate: Zero bpm

CRL: 4  mm  6 w  1 d          US EDC: 07/19/2013

Maternal uterus/adnexae:
The ovaries are normal.
IMPRESSION: Intrauterine gestational sac, yolk sac, and fetal pole but no
cardiac activity is visualized.
Findings are suspicious but not yet definitive for failed
pregnancy.  Recommend follow-up US in 10-14 days for definitive
diagnosis.  This recommendation follows SRU consensus guidelines:
Diagnostic Criteria for Nonviable Pregnancy Early in the First
Trimester.  N Engl J Med 9182; [DATE].

## 2014-07-23 ENCOUNTER — Encounter (HOSPITAL_COMMUNITY): Payer: Self-pay | Admitting: *Deleted

## 2014-08-20 ENCOUNTER — Emergency Department (HOSPITAL_COMMUNITY)
Admission: EM | Admit: 2014-08-20 | Discharge: 2014-08-20 | Disposition: A | Discharge status: Home / Self Care | Payer: Self-pay | Source: Home / Self Care

## 2016-06-16 DIAGNOSIS — N911 Secondary amenorrhea: Secondary | ICD-10-CM | POA: Diagnosis not present

## 2016-06-16 DIAGNOSIS — Z3201 Encounter for pregnancy test, result positive: Secondary | ICD-10-CM | POA: Diagnosis not present

## 2016-07-07 DIAGNOSIS — O34211 Maternal care for low transverse scar from previous cesarean delivery: Secondary | ICD-10-CM | POA: Diagnosis not present

## 2016-07-07 DIAGNOSIS — Z124 Encounter for screening for malignant neoplasm of cervix: Secondary | ICD-10-CM | POA: Diagnosis not present

## 2016-07-07 DIAGNOSIS — Z1151 Encounter for screening for human papillomavirus (HPV): Secondary | ICD-10-CM | POA: Diagnosis not present

## 2016-07-07 DIAGNOSIS — Z362 Encounter for other antenatal screening follow-up: Secondary | ICD-10-CM | POA: Diagnosis not present

## 2016-07-07 DIAGNOSIS — O26891 Other specified pregnancy related conditions, first trimester: Secondary | ICD-10-CM | POA: Diagnosis not present

## 2016-07-07 DIAGNOSIS — Z3A09 9 weeks gestation of pregnancy: Secondary | ICD-10-CM | POA: Diagnosis not present

## 2016-07-07 LAB — OB RESULTS CONSOLE HEPATITIS B SURFACE ANTIGEN: HEP B S AG: NEGATIVE

## 2016-07-07 LAB — OB RESULTS CONSOLE GC/CHLAMYDIA
CHLAMYDIA, DNA PROBE: NEGATIVE
Gonorrhea: NEGATIVE

## 2016-07-07 LAB — OB RESULTS CONSOLE RPR: RPR: NONREACTIVE

## 2016-07-07 LAB — OB RESULTS CONSOLE HIV ANTIBODY (ROUTINE TESTING): HIV: NONREACTIVE

## 2016-07-07 LAB — OB RESULTS CONSOLE RUBELLA ANTIBODY, IGM: RUBELLA: IMMUNE

## 2016-09-11 DIAGNOSIS — Z363 Encounter for antenatal screening for malformations: Secondary | ICD-10-CM | POA: Diagnosis not present

## 2016-09-11 DIAGNOSIS — Z3A18 18 weeks gestation of pregnancy: Secondary | ICD-10-CM | POA: Diagnosis not present

## 2016-09-21 NOTE — L&D Delivery Note (Signed)
Delivery Note At 2:00 PM a viable female was delivered via Vaginal, Spontaneous Delivery (Presentation: vtx; LOA ).  APGAR: 8, 9; weight pending.   Placenta status: spontaneous, intact, trai;ing membranes removed manually.  Cord:  with the following complications: nuchal x 1 delivered through.  Anesthesia:  Epidural Episiotomy:  None Lacerations:  Small, hemostatic right labial Suture Repair: none Est. Blood Loss (mL):  200  Mom to postpartum.  Baby to Couplet care / Skin to Skin.  Leighton Roachodd D Kirill Chatterjee 02/08/2017, 2:15 PM

## 2016-09-25 ENCOUNTER — Encounter (HOSPITAL_COMMUNITY): Payer: Self-pay | Admitting: Family Medicine

## 2016-09-25 ENCOUNTER — Ambulatory Visit (HOSPITAL_COMMUNITY)
Admission: EM | Admit: 2016-09-25 | Discharge: 2016-09-25 | Disposition: A | Discharge status: Home / Self Care | Payer: 59 | Attending: Family Medicine | Admitting: Family Medicine

## 2016-09-25 DIAGNOSIS — S00511A Abrasion of lip, initial encounter: Secondary | ICD-10-CM | POA: Diagnosis not present

## 2016-09-25 DIAGNOSIS — S161XXA Strain of muscle, fascia and tendon at neck level, initial encounter: Secondary | ICD-10-CM

## 2016-09-25 MED ORDER — AMOXICILLIN 875 MG PO TABS: 875.0000 mg | ORAL_TABLET | Freq: Two times a day (BID) | ORAL | 0 refills | Status: DC

## 2016-09-25 MED ORDER — AMOXICILLIN 875 MG PO TABS: 875.0000 mg | ORAL_TABLET | Freq: Two times a day (BID) | ORAL | 0 refills | Status: AC

## 2016-09-25 MED FILL — AMOXICILLIN 875 MG TABLET: 875 | 7 days supply | Qty: 14 | Fill #0
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
## 2016-09-25 NOTE — Discharge Instructions (Signed)
Start Amoxicillin 875mg  twice a day as directed. May apply Vaseline to lip to help with healing. Follow-up with your OB/GYN in 3 to 4 days if not improving.

## 2016-11-05 DIAGNOSIS — Z3A26 26 weeks gestation of pregnancy: Secondary | ICD-10-CM | POA: Diagnosis not present

## 2016-11-05 DIAGNOSIS — Z3689 Encounter for other specified antenatal screening: Secondary | ICD-10-CM | POA: Diagnosis not present

## 2016-11-05 DIAGNOSIS — Z3482 Encounter for supervision of other normal pregnancy, second trimester: Secondary | ICD-10-CM | POA: Diagnosis not present

## 2016-11-18 DIAGNOSIS — Z3A28 28 weeks gestation of pregnancy: Secondary | ICD-10-CM | POA: Diagnosis not present

## 2016-11-18 DIAGNOSIS — Z23 Encounter for immunization: Secondary | ICD-10-CM | POA: Diagnosis not present

## 2016-11-18 DIAGNOSIS — O34211 Maternal care for low transverse scar from previous cesarean delivery: Secondary | ICD-10-CM | POA: Diagnosis not present

## 2016-12-11 DIAGNOSIS — O26853 Spotting complicating pregnancy, third trimester: Secondary | ICD-10-CM | POA: Diagnosis not present

## 2016-12-11 DIAGNOSIS — Z3A31 31 weeks gestation of pregnancy: Secondary | ICD-10-CM | POA: Diagnosis not present

## 2017-01-14 DIAGNOSIS — Z3685 Encounter for antenatal screening for Streptococcus B: Secondary | ICD-10-CM | POA: Diagnosis not present

## 2017-01-14 DIAGNOSIS — Z3A36 36 weeks gestation of pregnancy: Secondary | ICD-10-CM | POA: Diagnosis not present

## 2017-01-14 DIAGNOSIS — O34211 Maternal care for low transverse scar from previous cesarean delivery: Secondary | ICD-10-CM | POA: Diagnosis not present

## 2017-01-15 LAB — OB RESULTS CONSOLE GBS: GBS: NEGATIVE

## 2017-02-08 ENCOUNTER — Inpatient Hospital Stay (HOSPITAL_COMMUNITY)
Admission: RE | Admit: 2017-02-08 | Discharge: 2017-02-09 | DRG: 775 | Disposition: A | Discharge status: Home / Self Care | Payer: 59 | Source: Ambulatory Visit | Attending: Obstetrics and Gynecology | Admitting: Obstetrics and Gynecology

## 2017-02-08 ENCOUNTER — Inpatient Hospital Stay (HOSPITAL_COMMUNITY): Payer: 59 | Admitting: Anesthesiology

## 2017-02-08 ENCOUNTER — Encounter (HOSPITAL_COMMUNITY): Payer: Self-pay | Admitting: *Deleted

## 2017-02-08 VITALS — BP 126/77 | HR 51 | Temp 98.3°F | Resp 16 | Ht 62.0 in | Wt 184.0 lb

## 2017-02-08 DIAGNOSIS — Z3493 Encounter for supervision of normal pregnancy, unspecified, third trimester: Secondary | ICD-10-CM

## 2017-02-08 DIAGNOSIS — O34211 Maternal care for low transverse scar from previous cesarean delivery: Principal | ICD-10-CM | POA: Diagnosis present

## 2017-02-08 DIAGNOSIS — Z833 Family history of diabetes mellitus: Secondary | ICD-10-CM | POA: Diagnosis not present

## 2017-02-08 DIAGNOSIS — Z3A40 40 weeks gestation of pregnancy: Secondary | ICD-10-CM

## 2017-02-08 DIAGNOSIS — Z8249 Family history of ischemic heart disease and other diseases of the circulatory system: Secondary | ICD-10-CM | POA: Diagnosis not present

## 2017-02-08 DIAGNOSIS — O34219 Maternal care for unspecified type scar from previous cesarean delivery: Secondary | ICD-10-CM

## 2017-02-08 LAB — CBC
HEMATOCRIT: 33 % — AB (ref 36.0–46.0)
HEMOGLOBIN: 10.8 g/dL — AB (ref 12.0–15.0)
MCH: 29.1 pg (ref 26.0–34.0)
MCHC: 32.7 g/dL (ref 30.0–36.0)
MCV: 88.9 fL (ref 78.0–100.0)
PLATELETS: 152 10*3/uL (ref 150–400)
RBC: 3.71 MIL/uL — AB (ref 3.87–5.11)
RDW: 14.9 % (ref 11.5–15.5)
WBC: 6.5 10*3/uL (ref 4.0–10.5)

## 2017-02-08 LAB — TYPE AND SCREEN
ABO/RH(D): O POS
Antibody Screen: NEGATIVE

## 2017-02-08 LAB — RPR: RPR: NONREACTIVE

## 2017-02-08 MED ORDER — ONDANSETRON HCL 4 MG PO TABS: 4.0000 mg | ORAL_TABLET | ORAL | Status: DC | PRN

## 2017-02-08 MED ORDER — MEASLES, MUMPS & RUBELLA VAC ~~LOC~~ INJ
0.5000 mL | INJECTION | Freq: Once | SUBCUTANEOUS | Status: DC
Start: 2017-02-09 — End: 2017-02-09
  Filled 2017-02-08: qty 0.5

## 2017-02-08 MED ORDER — COCONUT OIL OIL: 1.0000 "application " | TOPICAL_OIL | Status: DC | PRN

## 2017-02-08 MED ORDER — IBUPROFEN 600 MG PO TABS
600.0000 mg | ORAL_TABLET | Freq: Four times a day (QID) | ORAL | Status: DC
  Administered 2017-02-08 – 2017-02-09 (×4): 600 mg via ORAL
  Filled 2017-02-08 (×4): qty 1

## 2017-02-08 MED ORDER — BENZOCAINE-MENTHOL 20-0.5 % EX AERO: 1.0000 "application " | INHALATION_SPRAY | CUTANEOUS | Status: DC | PRN

## 2017-02-08 MED ORDER — ONDANSETRON HCL 4 MG/2ML IJ SOLN: 4.0000 mg | Freq: Four times a day (QID) | INTRAMUSCULAR | Status: DC | PRN

## 2017-02-08 MED ORDER — MAGNESIUM HYDROXIDE 400 MG/5ML PO SUSP: 30.0000 mL | ORAL | Status: DC | PRN

## 2017-02-08 MED ORDER — METHYLERGONOVINE MALEATE 0.2 MG PO TABS: 0.2000 mg | ORAL_TABLET | ORAL | Status: DC | PRN

## 2017-02-08 MED ORDER — TETANUS-DIPHTH-ACELL PERTUSSIS 5-2.5-18.5 LF-MCG/0.5 IM SUSP: 0.5000 mL | Freq: Once | INTRAMUSCULAR | Status: DC

## 2017-02-08 MED ORDER — OXYTOCIN 40 UNITS IN LACTATED RINGERS INFUSION - SIMPLE MED
2.5000 [IU]/h | INTRAVENOUS | Status: DC
  Administered 2017-02-08: 2.5 [IU]/h via INTRAVENOUS

## 2017-02-08 MED ORDER — DIPHENHYDRAMINE HCL 25 MG PO CAPS
25.0000 mg | ORAL_CAPSULE | Freq: Four times a day (QID) | ORAL | Status: DC | PRN
Start: 2017-02-08 — End: 2017-02-09

## 2017-02-08 MED ORDER — DIPHENHYDRAMINE HCL 50 MG/ML IJ SOLN: 12.5000 mg | INTRAMUSCULAR | Status: DC | PRN

## 2017-02-08 MED ORDER — OXYCODONE-ACETAMINOPHEN 5-325 MG PO TABS: 1.0000 | ORAL_TABLET | ORAL | Status: DC | PRN

## 2017-02-08 MED ORDER — OXYCODONE-ACETAMINOPHEN 5-325 MG PO TABS: 2.0000 | ORAL_TABLET | ORAL | Status: DC | PRN

## 2017-02-08 MED ORDER — SIMETHICONE 80 MG PO CHEW: 80.0000 mg | CHEWABLE_TABLET | ORAL | Status: DC | PRN

## 2017-02-08 MED ORDER — FENTANYL 2.5 MCG/ML BUPIVACAINE 1/10 % EPIDURAL INFUSION (WH - ANES)
14.0000 mL/h | INTRAMUSCULAR | Status: DC | PRN
  Administered 2017-02-08: 14 mL/h via EPIDURAL
  Filled 2017-02-08: qty 100

## 2017-02-08 MED ORDER — LIDOCAINE HCL (PF) 1 % IJ SOLN
30.0000 mL | INTRAMUSCULAR | Status: DC | PRN
  Filled 2017-02-08: qty 30

## 2017-02-08 MED ORDER — SOD CITRATE-CITRIC ACID 500-334 MG/5ML PO SOLN: 30.0000 mL | ORAL | Status: DC | PRN

## 2017-02-08 MED ORDER — OXYCODONE HCL 5 MG PO TABS: 5.0000 mg | ORAL_TABLET | ORAL | Status: DC | PRN

## 2017-02-08 MED ORDER — OXYCODONE HCL 5 MG PO TABS: 10.0000 mg | ORAL_TABLET | ORAL | Status: DC | PRN

## 2017-02-08 MED ORDER — OXYTOCIN BOLUS FROM INFUSION
500.0000 mL | Freq: Once | INTRAVENOUS | Status: AC
  Administered 2017-02-08: 500 mL via INTRAVENOUS

## 2017-02-08 MED ORDER — TERBUTALINE SULFATE 1 MG/ML IJ SOLN
0.2500 mg | Freq: Once | INTRAMUSCULAR | Status: DC | PRN
  Filled 2017-02-08: qty 1

## 2017-02-08 MED ORDER — DIBUCAINE 1 % RE OINT: 1.0000 "application " | TOPICAL_OINTMENT | RECTAL | Status: DC | PRN

## 2017-02-08 MED ORDER — METHYLERGONOVINE MALEATE 0.2 MG/ML IJ SOLN: 0.2000 mg | INTRAMUSCULAR | Status: DC | PRN

## 2017-02-08 MED ORDER — PHENYLEPHRINE 40 MCG/ML (10ML) SYRINGE FOR IV PUSH (FOR BLOOD PRESSURE SUPPORT)
80.0000 ug | PREFILLED_SYRINGE | INTRAVENOUS | Status: DC | PRN
  Filled 2017-02-08: qty 5
  Filled 2017-02-08: qty 10

## 2017-02-08 MED ORDER — OXYTOCIN 40 UNITS IN LACTATED RINGERS INFUSION - SIMPLE MED
1.0000 m[IU]/min | INTRAVENOUS | Status: DC
  Administered 2017-02-08: 2 m[IU]/min via INTRAVENOUS
  Filled 2017-02-08: qty 1000

## 2017-02-08 MED ORDER — BUTORPHANOL TARTRATE 1 MG/ML IJ SOLN: 1.0000 mg | INTRAMUSCULAR | Status: DC | PRN

## 2017-02-08 MED ORDER — SENNOSIDES-DOCUSATE SODIUM 8.6-50 MG PO TABS
2.0000 | ORAL_TABLET | ORAL | Status: DC
  Administered 2017-02-08: 2 via ORAL
  Filled 2017-02-08: qty 2

## 2017-02-08 MED ORDER — ZOLPIDEM TARTRATE 5 MG PO TABS: 5.0000 mg | ORAL_TABLET | Freq: Every evening | ORAL | Status: DC | PRN

## 2017-02-08 MED ORDER — ACETAMINOPHEN 325 MG PO TABS: 650.0000 mg | ORAL_TABLET | ORAL | Status: DC | PRN

## 2017-02-08 MED ORDER — LACTATED RINGERS IV SOLN
500.0000 mL | INTRAVENOUS | Status: DC | PRN
  Administered 2017-02-08: 500 mL via INTRAVENOUS

## 2017-02-08 MED ORDER — PHENYLEPHRINE 40 MCG/ML (10ML) SYRINGE FOR IV PUSH (FOR BLOOD PRESSURE SUPPORT)
80.0000 ug | PREFILLED_SYRINGE | INTRAVENOUS | Status: DC | PRN
  Filled 2017-02-08: qty 5

## 2017-02-08 MED ORDER — EPHEDRINE 5 MG/ML INJ
10.0000 mg | INTRAVENOUS | Status: DC | PRN
  Filled 2017-02-08: qty 2

## 2017-02-08 MED ORDER — ONDANSETRON HCL 4 MG/2ML IJ SOLN: 4.0000 mg | INTRAMUSCULAR | Status: DC | PRN

## 2017-02-08 MED ORDER — PRENATAL MULTIVITAMIN CH
1.0000 | ORAL_TABLET | Freq: Every day | ORAL | Status: DC
  Administered 2017-02-09: 1 via ORAL
  Filled 2017-02-08: qty 1

## 2017-02-08 MED ORDER — LACTATED RINGERS IV SOLN
INTRAVENOUS | Status: DC
Start: 2017-02-08 — End: 2017-02-08
  Administered 2017-02-08: 08:00:00 via INTRAVENOUS

## 2017-02-08 MED ORDER — LIDOCAINE HCL (PF) 1 % IJ SOLN
INTRAMUSCULAR | Status: DC | PRN
  Administered 2017-02-08: 13 mL via EPIDURAL

## 2017-02-08 MED ORDER — WITCH HAZEL-GLYCERIN EX PADS: 1.0000 "application " | MEDICATED_PAD | CUTANEOUS | Status: DC | PRN

## 2017-02-08 MED ORDER — LACTATED RINGERS IV SOLN: 500.0000 mL | Freq: Once | INTRAVENOUS | Status: DC

## 2017-02-08 NOTE — H&P (Signed)
Minus Victoria Lopez is a 26 y.o. female, G5 [id number], EGA [redacted] weeks with EDC today presenting for induction.  Prenatal course complicated by previous LTCS and VBAC, wants to VBAC again.  OB History    Gravida Para Term Preterm AB Living   5 2 2   1 2    SAB TAB Ectopic Multiple Live Births   1       2     Past Medical History:  Diagnosis Date  . Medical history non-contributory   . Miscarriage   . No pertinent past medical history    Past Surgical History:  Procedure Laterality Date  . CESAREAN SECTION     Family History: family history includes Alcohol abuse in her paternal grandfather; Asthma in her sister; Diabetes in her maternal aunt and mother; Hyperlipidemia in her father; Hypertension in her sister. Social History:  reports that she has never smoked. She has never used smokeless tobacco. She reports that she does not drink alcohol or use drugs.     Maternal Diabetes: No Genetic Screening: Declined Maternal Ultrasounds/Referrals: Normal Fetal Ultrasounds or other Referrals:  None Maternal Substance Abuse:  No Significant Maternal Medications:  None Significant Maternal Lab Results:  Lab values include: Group B Strep negative Other Comments:  None  Review of Systems  Respiratory: Negative.   Cardiovascular: Negative.    Maternal Medical History:  Fetal activity: Perceived fetal activity is normal.    Prenatal complications: no prenatal complications Prenatal Complications - Diabetes: none.    Dilation: 3 Effacement (%): 60 Station: -2 Exam by:: Victoria Plateravis, RN  Blood pressure 115/75, pulse 78, temperature 98.4 F (36.9 C), temperature source Oral, resp. rate 18, height 5\' 2"  (1.575 m), weight 83.5 kg (184 lb), unknown if currently breastfeeding. Maternal Exam:  Uterine Assessment: Contraction strength is mild.  Contraction frequency is irregular.   Abdomen: Patient reports no abdominal tenderness. Surgical scars: low transverse.   Estimated fetal weight is 7 1/2 lbs.    Fetal presentation: vertex  Introitus: Normal vulva. Normal vagina.  Amniotic fluid character: not assessed.  Pelvis: adequate for delivery.   Cervix: Cervix evaluated by digital exam.     Fetal Exam Fetal Monitor Review: Mode: ultrasound.   Baseline rate: 140.  Variability: moderate (6-25 bpm).   Pattern: accelerations present and no decelerations.    Fetal State Assessment: Category I - tracings are normal.     Physical Exam  Vitals reviewed. Constitutional: She appears well-developed and well-nourished.  Cardiovascular: Normal rate and regular rhythm.   Respiratory: Effort normal. No respiratory distress.  GI: Soft.    Prenatal labs: ABO, Rh:  O pos Antibody:  neg Rubella:  Immune RPR:  NR  HBsAg:  Neg  HIV:  NR  GBS:   Neg  Assessment/Plan: IUP at 40 weeks, previous LTCS and VBAC, desires another VBAC, for induction.  On pitocin, monitor progress, anticipate VBAC.  Risks of pitocin and VBAC discussed.     [name] 02/08/2017, 8:36 AM

## 2017-02-08 NOTE — Anesthesia Pain Management Evaluation Note (Signed)
  CRNA Pain Management Visit Note  Patient: Victoria Lopez, 26 y.o., female  "Hello I am a member of the anesthesia team at [facility]. We have an anesthesia team available at all times to provide care throughout the hospital, including epidural management and anesthesia for C-section. I don't know your plan for the delivery whether it a natural birth, water birth, IV sedation, nitrous supplementation, doula or epidural, but we want to meet your pain goals."   1.Was your pain managed to your expectations on prior hospitalizations?   Yes   2.What is your expectation for pain management during this hospitalization?     Epidural  3.How can we help you reach that goal? unsure  Record the patient's initial score and the patient's pain goal.   Pain: 0  Pain Goal: 7 [facility] wants you to be able to say your pain was always managed very well.  Cephus ShellingBURGER,Marlissa Emerick 02/08/2017

## 2017-02-08 NOTE — Progress Notes (Signed)
Patient ID: Victoria Lopez, female   DOB: 09-13-1991, 26 y.o.   MRN: [id number] Feeling ctx, had some RUQ pain which resolved Afeb, VSS FHT- Cat I, 130s, ctx q 2-3 min VE-4/70/-1, vtx, AROM clear Continue pitocin, monitor progress

## 2017-02-08 NOTE — Anesthesia Preprocedure Evaluation (Signed)
Anesthesia Evaluation  Patient identified by MRN, date of birth, ID band Patient awake    Reviewed: Allergy & Precautions, H&P , NPO status , Patient's Chart, lab work & pertinent test results  History of Anesthesia Complications Negative for: history of anesthetic complications  Airway Mallampati: II  TM Distance: >3 FB Neck ROM: full    Dental no notable dental hx. (+) Teeth Intact   Pulmonary neg pulmonary ROS,    Pulmonary exam normal breath sounds clear to auscultation       Cardiovascular negative cardio ROS   Rhythm:regular Rate:Normal     Neuro/Psych negative neurological ROS  negative psych ROS   GI/Hepatic negative GI ROS, Neg liver ROS,   Endo/Other  negative endocrine ROS  Renal/GU negative Renal ROS  negative genitourinary   Musculoskeletal   Abdominal Normal abdominal exam  (+)   Peds  Hematology negative hematology ROS (+)   Anesthesia Other Findings   Reproductive/Obstetrics (+) Pregnancy                             Anesthesia Physical  Anesthesia Plan  ASA: II  Anesthesia Plan: Epidural   Post-op Pain Management:    Induction:   Airway Management Planned:   Additional Equipment:   Intra-op Plan:   Post-operative Plan:   Informed Consent: I have reviewed the patients History and Physical, chart, labs and discussed the procedure including the risks, benefits and alternatives for the proposed anesthesia with the patient or authorized representative who has indicated his/her understanding and acceptance.     Plan Discussed with:   Anesthesia Plan Comments:         Anesthesia Quick Evaluation

## 2017-02-08 NOTE — Anesthesia Procedure Notes (Signed)
Epidural Patient location during procedure: OB Start time: 02/08/2017 1:21 PM End time: 02/08/2017 1:35 PM  Staffing Anesthesiologist: Anitra LauthMILLER, Salimatou Simone RAY Performed: anesthesiologist   Preanesthetic Checklist Completed: patient identified, site marked, surgical consent, pre-op evaluation, timeout performed, IV checked, risks and benefits discussed and monitors and equipment checked  Epidural Patient position: sitting Prep: DuraPrep Patient monitoring: heart rate, cardiac monitor, continuous pulse ox and blood pressure Approach: midline Location: L2-L3 Injection technique: LOR saline  Needle:  Needle type: Tuohy  Needle gauge: 17 G Needle length: 9 cm Needle insertion depth: 5 cm Catheter type: closed end flexible Catheter size: 20 Guage Catheter at skin depth: 8 cm Test dose: negative  Assessment Events: blood not aspirated, injection not painful, no injection resistance, negative IV test and no paresthesia  Additional Notes Reason for block:procedure for pain

## 2017-02-08 NOTE — Anesthesia Postprocedure Evaluation (Signed)
Anesthesia Post Note  Patient: Victoria Lopez  Procedure(s) Performed: * No procedures listed *  Patient location during evaluation: Mother Baby Anesthesia Type: Epidural Level of consciousness: awake, awake and alert, oriented and patient cooperative Pain management: pain level controlled Vital Signs Assessment: post-procedure vital signs reviewed and stable Respiratory status: spontaneous breathing, nonlabored ventilation and respiratory function stable Cardiovascular status: stable Postop Assessment: no headache, no backache, patient able to bend at knees and no signs of nausea or vomiting Anesthetic complications: no        Last Vitals:  Vitals:   02/08/17 1600 02/08/17 1700  BP: 111/69 111/75  Pulse: (!) 55 (!) 59  Resp: 18 18  Temp: 36.7 C 36.9 C    Last Pain:  Vitals:   02/08/17 1753  TempSrc:   PainSc: 0-No pain   Pain Goal:                 Ulric Salzman L

## 2017-02-09 MED ORDER — IBUPROFEN 600 MG PO TABS: 600.0000 mg | ORAL_TABLET | Freq: Four times a day (QID) | ORAL | 1 refills | Status: DC | PRN

## 2017-02-09 NOTE — Progress Notes (Signed)
Patient ID: Victoria Lopez, female   DOB: 11-13-1990, 26 y.o.   MRN: [id number] Pt doing well. Pain well controlled. Lochia mild. Denies any CP, SOB, fever or HA. No bladder or bowel complaints. Bonding well with baby - bottlefeeding. Requests early discharge to home VSS ABD- FF and below umbil EXT - no Homans or edema  A/P: PPD #1 s/p svd - stable         Reviewed discharge instructions         Unsure BC

## 2017-02-09 NOTE — Discharge Summary (Signed)
OB Discharge Summary     Patient Name: Victoria Lopez DOB: 1991/03/03 MRN: [id number]  Date of admission: 02/08/2017 Delivering MD: Jackelyn KnifeMEISINGER, TODD   Date of discharge: 02/09/2017  Admitting diagnosis: INDUCTION Intrauterine pregnancy: 4667w0d     Secondary diagnosis:  Active Problems:   Normal pregnancy in third trimester  Additional problems: none     Discharge diagnosis: VBAC                                                                                                Post partum procedures:none  Augmentation: AROM and Pitocin  Complications: None  Hospital course:  Induction of Labor With Vaginal Delivery   26 y.o. yo [id number] at 3167w0d was admitted to the hospital 02/08/2017 for induction of labor.  Indication for induction: Favorable cervix at term.  Patient had an uncomplicated labor course as follows: Membrane Rupture Time/Date: 12:20 PM ,02/08/2017   Intrapartum Procedures: Episiotomy: None [1]                                         Lacerations:  Labial [10]  Patient had delivery of a Viable infant.  Information for the patient's newborn:  Aida PufferBeristain, Girl Sue Lushndrea [[id number]][[id number]]  Delivery Method: VBAC, Spontaneous (Filed from Delivery Summary)   02/08/2017  Details of delivery can be found in separate delivery note.  Patient had a routine postpartum course. Patient is discharged home 02/09/17.  Physical exam  Vitals:   02/08/17 1600 02/08/17 1700 02/08/17 2123 02/09/17 0525  BP: 111/69 111/75 120/60 126/77  Pulse: (!) 55 (!) 59 60 (!) 51  Resp: 18 18 16 16   Temp: 98 F (36.7 C) 98.4 F (36.9 C) 98.3 F (36.8 C)   TempSrc: Oral Oral Oral   SpO2: 98% 98%    Weight:      Height:       General: alert, cooperative and no distress Lochia: appropriate Uterine Fundus: firm Incision: N/A DVT Evaluation: No evidence of DVT seen on physical exam. No significant calf/ankle edema. Labs: Lab Results  Component Value Date   WBC 6.5 02/08/2017   HGB 10.8 (L)  02/08/2017   HCT 33.0 (L) 02/08/2017   MCV 88.9 02/08/2017   PLT 152 02/08/2017   CMP Latest Ref Rng & Units 11/21/2013  Glucose 70 - 99 mg/dL 85  BUN 6 - 23 mg/dL 9  Creatinine 1.300.50 - 8.651.10 mg/dL 7.840.65  Sodium 696137 - 295147 mEq/L 141  Potassium 3.7 - 5.3 mEq/L 3.8  Chloride 96 - 112 mEq/L 103  CO2 19 - 32 mEq/L 25  Calcium 8.4 - 10.5 mg/dL 8.8  Total Protein 6.0 - 8.3 g/dL 6.6  Total Bilirubin 0.3 - 1.2 mg/dL <2.8(U<0.2(L)  Alkaline Phos 39 - 117 U/L 96  AST 0 - 37 U/L 16  ALT 0 - 35 U/L 15    Discharge instruction: per After Visit Summary and "Baby and Me Booklet".  After visit meds:  Allergies as of 02/09/2017   No Known Allergies  Medication List    TAKE these medications   acetaminophen 500 MG tablet Commonly known as:  TYLENOL Take 1,000 mg by mouth every 6 (six) hours as needed.   ibuprofen 600 MG tablet Commonly known as:  ADVIL,MOTRIN Take 1 tablet (600 mg total) by mouth every 6 (six) hours as needed.   prenatal multivitamin Tabs tablet Take 1 tablet by mouth daily at 12 noon.       Diet: routine diet  Activity: Advance as tolerated. Pelvic rest for 6 weeks.   Outpatient follow up:2 weeks Follow up Appt:No future appointments. Follow up Visit:No Follow-up on file.  Postpartum contraception: Undecided  Newborn Data: Live born female  Birth Weight: 6 lb 10.5 oz (3020 g) APGAR: 8, 9  Baby Feeding: Bottle Disposition:home with mother   02/09/2017 Cathrine Muster, DO

## 2017-02-09 NOTE — Discharge Instructions (Signed)
Nothing in vagina for 6 weeks.  No sex, tampons, and douching.  Other instructions as in [facility] Discharge Booklet. °

## 2017-04-01 DIAGNOSIS — Z130 Encounter for screening for diseases of the blood and blood-forming organs and certain disorders involving the immune mechanism: Secondary | ICD-10-CM | POA: Diagnosis not present

## 2017-04-01 DIAGNOSIS — Z1389 Encounter for screening for other disorder: Secondary | ICD-10-CM | POA: Diagnosis not present

## 2017-04-01 DIAGNOSIS — Z30430 Encounter for insertion of intrauterine contraceptive device: Secondary | ICD-10-CM | POA: Diagnosis not present

## 2017-04-01 DIAGNOSIS — Z3202 Encounter for pregnancy test, result negative: Secondary | ICD-10-CM | POA: Diagnosis not present

## 2017-07-13 DIAGNOSIS — Z30431 Encounter for routine checking of intrauterine contraceptive device: Secondary | ICD-10-CM | POA: Diagnosis not present

## 2018-09-08 LAB — OB RESULTS CONSOLE ANTIBODY SCREEN: Antibody Screen: NEGATIVE

## 2018-09-08 LAB — OB RESULTS CONSOLE RPR: RPR: NONREACTIVE

## 2018-09-08 LAB — OB RESULTS CONSOLE GC/CHLAMYDIA
Chlamydia: NEGATIVE
Gonorrhea: NEGATIVE

## 2018-09-08 LAB — OB RESULTS CONSOLE ABO/RH: RH Type: POSITIVE

## 2018-09-08 LAB — OB RESULTS CONSOLE HEPATITIS B SURFACE ANTIGEN: Hepatitis B Surface Ag: NEGATIVE

## 2018-09-08 LAB — OB RESULTS CONSOLE RUBELLA ANTIBODY, IGM: Rubella: IMMUNE

## 2018-09-08 LAB — OB RESULTS CONSOLE HIV ANTIBODY (ROUTINE TESTING): HIV: NONREACTIVE

## 2018-09-21 NOTE — L&D Delivery Note (Signed)
Delivery Note Pt reached complete dilation and pushed great.  At 5:19 PM a viable female was delivered via VBAC, Spontaneous (Presentation:ROA ).  APGAR: 9, 9; weight  pending.   Placenta status: slightly adherent but delivered spontaneously with massage.  Cord:  with the following complications: none. Fundus inspected manually after delivery with no obvious placental fragments felt.   Anesthesia: Epidural  Episiotomy: None Lacerations: Labial, small right-- repaired for hemostasis Suture Repair: 3.0 vicryl rapide Est. Blood Loss (mL):  Mom to postpartum.  Baby to Couplet care / Skin to Skin. D/w pt circumcision and they decline  Victoria Lopez 02/17/2019, 6:00 PM

## 2019-02-02 LAB — OB RESULTS CONSOLE GBS: GBS: POSITIVE

## 2019-02-10 ENCOUNTER — Telehealth (HOSPITAL_COMMUNITY): Payer: Self-pay | Admitting: *Deleted

## 2019-02-10 ENCOUNTER — Encounter (HOSPITAL_COMMUNITY): Payer: Self-pay | Admitting: *Deleted

## 2019-02-10 NOTE — Telephone Encounter (Signed)
Preadmission screen  

## 2019-02-15 ENCOUNTER — Other Ambulatory Visit: Payer: Self-pay

## 2019-02-15 ENCOUNTER — Other Ambulatory Visit (HOSPITAL_COMMUNITY)
Admission: RE | Admit: 2019-02-15 | Discharge: 2019-02-15 | Disposition: A | Discharge status: Home / Self Care | Payer: Medicaid Other | Source: Ambulatory Visit | Attending: Obstetrics and Gynecology | Admitting: Obstetrics and Gynecology

## 2019-02-15 DIAGNOSIS — Z1159 Encounter for screening for other viral diseases: Secondary | ICD-10-CM | POA: Insufficient documentation

## 2019-02-15 NOTE — MAU Note (Signed)
Covid swab collected. Pt tolerated well. PT asymptomatic 

## 2019-02-16 ENCOUNTER — Other Ambulatory Visit: Payer: Self-pay | Admitting: Obstetrics and Gynecology

## 2019-02-16 ENCOUNTER — Other Ambulatory Visit (HOSPITAL_COMMUNITY): Payer: Self-pay | Admitting: *Deleted

## 2019-02-16 LAB — NOVEL CORONAVIRUS, NAA (HOSP ORDER, SEND-OUT TO REF LAB; TAT 18-24 HRS): SARS-CoV-2, NAA: NOT DETECTED

## 2019-02-16 NOTE — H&P (Deleted)
  The note originally documented on this encounter has been moved the the encounter in which it belongs.  

## 2019-02-16 NOTE — H&P (Signed)
Victoria Lopez is a 28 y.o. [id number] at 56 0/7 weeks (EDD 02/24/19 by LMP c/w 15 week [place]) presenting for IOL at term.  Prenatal care significant for a prior LTCS with successful VBAC x 2. She is GBS positive.   She has signed papers for an interval BTL on 02/01/19, so will do it after hefr pp appointment.  OB History    Gravida  7   Para  3   Term  3   Preterm      AB  2   Living  3     SAB  2   TAB      Ectopic      Multiple  0   Live Births  3         08-15-2007, 41 wks F, 8lbs 4oz, Cesarean  09-21-2010, 12 wks  SAB 11-20-2011, 10 wks  SAB. 03-04-2014, 39.4 wks M, 7lbs 6oz, VBAC 02-08-2017, 40 wks. F, 6lbs 10oz, VBAC Past Medical History:  Diagnosis Date  . Medical history non-contributory   . Miscarriage   . No pertinent past medical history    Past Surgical History:  Procedure Laterality Date  . CESAREAN SECTION     Family History: family history includes Alcohol abuse in her paternal grandfather; Asthma in her sister; Diabetes in her maternal aunt and mother; Hyperlipidemia in her father; Hypertension in her sister. Social History:  reports that she has never smoked. She has never used smokeless tobacco. She reports that she does not drink alcohol or use drugs.     Maternal Diabetes: No Genetic Screening: Normal Maternal Ultrasounds/Referrals: Normal Fetal Ultrasounds or other Referrals:  None Maternal Substance Abuse:  No Significant Maternal Medications:  None Significant Maternal Lab Results:  Lab values include: Group B Strep positive Other Comments:  None  Review of Systems  Constitutional: Negative for fever.  Eyes: Negative for blurred vision.  Gastrointestinal: Negative for abdominal pain.  Neurological: Negative for headaches.   Maternal Medical History:  Contractions: Frequency: irregular.   Perceived severity is mild.    Fetal activity: Perceived fetal activity is normal.    Prenatal Complications - Diabetes: none.       Last menstrual period 05/20/2018, unknown if currently breastfeeding. Maternal Exam:  Uterine Assessment: Contraction strength is mild.  Contraction frequency is irregular.   Abdomen: Patient reports no abdominal tenderness. Surgical scars: low transverse.   Fetal presentation: vertex  Introitus: Normal vulva. Normal vagina.    Physical Exam  Constitutional: She appears well-developed.  Cardiovascular: Normal rate and regular rhythm.  Respiratory: Effort normal.  GI: Soft.  Genitourinary:    Vulva normal.   Neurological: She is alert.    Prenatal labs: ABO, Rh: O/Positive/-- (12/19 0000) Antibody: Negative (12/19 0000) Rubella: Immune (12/19 0000) RPR: Nonreactive (12/19 0000)  HBsAg: Negative (12/19 0000)  HIV: Non-reactive (12/19 0000)  GBS: Positive (05/14 0000)  One hour GCT 143 Essential panel negative Tetra screen negative  Assessment/Plan: Pt for IOL at term.  Will start pitocin and see if needs a foley bulb.  PCN for + GBS   Oliver Pila 02/16/2019, 9:04 PM

## 2019-02-17 ENCOUNTER — Inpatient Hospital Stay (HOSPITAL_COMMUNITY): Payer: Medicaid Other

## 2019-02-17 ENCOUNTER — Inpatient Hospital Stay (HOSPITAL_COMMUNITY): Payer: Medicaid Other | Admitting: Anesthesiology

## 2019-02-17 ENCOUNTER — Encounter (HOSPITAL_COMMUNITY): Payer: Self-pay | Admitting: *Deleted

## 2019-02-17 ENCOUNTER — Inpatient Hospital Stay (HOSPITAL_COMMUNITY)
Admission: AD | Admit: 2019-02-17 | Discharge: 2019-02-18 | DRG: 807 | Disposition: A | Discharge status: Home / Self Care | Payer: Medicaid Other | Attending: Obstetrics and Gynecology | Admitting: Obstetrics and Gynecology

## 2019-02-17 ENCOUNTER — Other Ambulatory Visit: Payer: Self-pay

## 2019-02-17 DIAGNOSIS — O99824 Streptococcus B carrier state complicating childbirth: Secondary | ICD-10-CM | POA: Diagnosis present

## 2019-02-17 DIAGNOSIS — Z3A39 39 weeks gestation of pregnancy: Secondary | ICD-10-CM | POA: Diagnosis not present

## 2019-02-17 DIAGNOSIS — O34211 Maternal care for low transverse scar from previous cesarean delivery: Secondary | ICD-10-CM | POA: Diagnosis present

## 2019-02-17 DIAGNOSIS — O34219 Maternal care for unspecified type scar from previous cesarean delivery: Secondary | ICD-10-CM | POA: Diagnosis present

## 2019-02-17 LAB — CBC
HCT: 31.2 % — ABNORMAL LOW (ref 36.0–46.0)
Hemoglobin: 10.3 g/dL — ABNORMAL LOW (ref 12.0–15.0)
MCH: 28.7 pg (ref 26.0–34.0)
MCHC: 33 g/dL (ref 30.0–36.0)
MCV: 86.9 fL (ref 80.0–100.0)
Platelets: 164 10*3/uL (ref 150–400)
RBC: 3.59 MIL/uL — ABNORMAL LOW (ref 3.87–5.11)
RDW: 14.6 % (ref 11.5–15.5)
WBC: 9.8 10*3/uL (ref 4.0–10.5)
nRBC: 0 % (ref 0.0–0.2)

## 2019-02-17 LAB — TYPE AND SCREEN
ABO/RH(D): O POS
Antibody Screen: NEGATIVE

## 2019-02-17 LAB — ABO/RH: ABO/RH(D): O POS

## 2019-02-17 MED ORDER — SODIUM CHLORIDE (PF) 0.9 % IJ SOLN
INTRAMUSCULAR | Status: DC | PRN
  Administered 2019-02-17: 11 mL/h via EPIDURAL

## 2019-02-17 MED ORDER — DIPHENHYDRAMINE HCL 25 MG PO CAPS: 25.0000 mg | ORAL_CAPSULE | Freq: Four times a day (QID) | ORAL | Status: DC | PRN

## 2019-02-17 MED ORDER — OXYTOCIN 40 UNITS IN NORMAL SALINE INFUSION - SIMPLE MED
2.5000 [IU]/h | INTRAVENOUS | Status: DC
  Administered 2019-02-17: 2.5 [IU]/h via INTRAVENOUS

## 2019-02-17 MED ORDER — LACTATED RINGERS IV SOLN: 500.0000 mL | Freq: Once | INTRAVENOUS | Status: DC

## 2019-02-17 MED ORDER — EPHEDRINE 5 MG/ML INJ: 10.0000 mg | INTRAVENOUS | Status: DC | PRN

## 2019-02-17 MED ORDER — LACTATED RINGERS IV SOLN
INTRAVENOUS | Status: DC
  Administered 2019-02-17: 09:00:00 via INTRAVENOUS

## 2019-02-17 MED ORDER — DIPHENHYDRAMINE HCL 50 MG/ML IJ SOLN: 12.5000 mg | INTRAMUSCULAR | Status: DC | PRN

## 2019-02-17 MED ORDER — OXYCODONE-ACETAMINOPHEN 5-325 MG PO TABS: 1.0000 | ORAL_TABLET | ORAL | Status: DC | PRN

## 2019-02-17 MED ORDER — OXYCODONE-ACETAMINOPHEN 5-325 MG PO TABS: 2.0000 | ORAL_TABLET | ORAL | Status: DC | PRN

## 2019-02-17 MED ORDER — WITCH HAZEL-GLYCERIN EX PADS: 1.0000 "application " | MEDICATED_PAD | CUTANEOUS | Status: DC | PRN

## 2019-02-17 MED ORDER — LIDOCAINE-EPINEPHRINE (PF) 2 %-1:200000 IJ SOLN
INTRAMUSCULAR | Status: DC | PRN
  Administered 2019-02-17: 2 mL via EPIDURAL
  Administered 2019-02-17: 3 mL via EPIDURAL

## 2019-02-17 MED ORDER — SIMETHICONE 80 MG PO CHEW: 80.0000 mg | CHEWABLE_TABLET | ORAL | Status: DC | PRN

## 2019-02-17 MED ORDER — ONDANSETRON HCL 4 MG/2ML IJ SOLN: 4.0000 mg | INTRAMUSCULAR | Status: DC | PRN

## 2019-02-17 MED ORDER — LACTATED RINGERS IV SOLN: 500.0000 mL | INTRAVENOUS | Status: DC | PRN

## 2019-02-17 MED ORDER — PENICILLIN G 3 MILLION UNITS IVPB - SIMPLE MED
3.0000 10*6.[IU] | INTRAVENOUS | Status: DC
  Administered 2019-02-17: 3 10*6.[IU] via INTRAVENOUS
  Filled 2019-02-17: qty 100

## 2019-02-17 MED ORDER — ZOLPIDEM TARTRATE 5 MG PO TABS: 5.0000 mg | ORAL_TABLET | Freq: Every evening | ORAL | Status: DC | PRN

## 2019-02-17 MED ORDER — ONDANSETRON HCL 4 MG/2ML IJ SOLN: 4.0000 mg | Freq: Four times a day (QID) | INTRAMUSCULAR | Status: DC | PRN

## 2019-02-17 MED ORDER — OXYTOCIN BOLUS FROM INFUSION
500.0000 mL | Freq: Once | INTRAVENOUS | Status: AC
  Administered 2019-02-17: 500 mL via INTRAVENOUS

## 2019-02-17 MED ORDER — ACETAMINOPHEN 325 MG PO TABS: 650.0000 mg | ORAL_TABLET | ORAL | Status: DC | PRN

## 2019-02-17 MED ORDER — BENZOCAINE-MENTHOL 20-0.5 % EX AERO: 1.0000 "application " | INHALATION_SPRAY | CUTANEOUS | Status: DC | PRN

## 2019-02-17 MED ORDER — TETANUS-DIPHTH-ACELL PERTUSSIS 5-2.5-18.5 LF-MCG/0.5 IM SUSP: 0.5000 mL | Freq: Once | INTRAMUSCULAR | Status: DC

## 2019-02-17 MED ORDER — TERBUTALINE SULFATE 1 MG/ML IJ SOLN: 0.2500 mg | Freq: Once | INTRAMUSCULAR | Status: DC | PRN

## 2019-02-17 MED ORDER — FENTANYL-BUPIVACAINE-NACL 0.5-0.125-0.9 MG/250ML-% EP SOLN
12.0000 mL/h | EPIDURAL | Status: DC | PRN
  Filled 2019-02-17: qty 250

## 2019-02-17 MED ORDER — ONDANSETRON HCL 4 MG PO TABS: 4.0000 mg | ORAL_TABLET | ORAL | Status: DC | PRN

## 2019-02-17 MED ORDER — PHENYLEPHRINE 40 MCG/ML (10ML) SYRINGE FOR IV PUSH (FOR BLOOD PRESSURE SUPPORT): 80.0000 ug | PREFILLED_SYRINGE | INTRAVENOUS | Status: DC | PRN

## 2019-02-17 MED ORDER — PRENATAL MULTIVITAMIN CH
1.0000 | ORAL_TABLET | Freq: Every day | ORAL | Status: DC
  Administered 2019-02-18: 1 via ORAL
  Filled 2019-02-17: qty 1

## 2019-02-17 MED ORDER — OXYTOCIN 40 UNITS IN NORMAL SALINE INFUSION - SIMPLE MED
1.0000 m[IU]/min | INTRAVENOUS | Status: DC
  Administered 2019-02-17: 2 m[IU]/min via INTRAVENOUS
  Filled 2019-02-17: qty 1000

## 2019-02-17 MED ORDER — COCONUT OIL OIL: 1.0000 "application " | TOPICAL_OIL | Status: DC | PRN

## 2019-02-17 MED ORDER — SODIUM CHLORIDE 0.9 % IV SOLN
5.0000 10*6.[IU] | Freq: Once | INTRAVENOUS | Status: AC
  Administered 2019-02-17: 5 10*6.[IU] via INTRAVENOUS
  Filled 2019-02-17: qty 5

## 2019-02-17 MED ORDER — SENNOSIDES-DOCUSATE SODIUM 8.6-50 MG PO TABS
2.0000 | ORAL_TABLET | ORAL | Status: DC
  Administered 2019-02-17: 2 via ORAL
  Filled 2019-02-17: qty 2

## 2019-02-17 MED ORDER — SOD CITRATE-CITRIC ACID 500-334 MG/5ML PO SOLN: 30.0000 mL | ORAL | Status: DC | PRN

## 2019-02-17 MED ORDER — IBUPROFEN 600 MG PO TABS
600.0000 mg | ORAL_TABLET | Freq: Four times a day (QID) | ORAL | Status: DC
  Administered 2019-02-17 – 2019-02-18 (×4): 600 mg via ORAL
  Filled 2019-02-17 (×4): qty 1

## 2019-02-17 MED ORDER — PHENYLEPHRINE 40 MCG/ML (10ML) SYRINGE FOR IV PUSH (FOR BLOOD PRESSURE SUPPORT)
80.0000 ug | PREFILLED_SYRINGE | INTRAVENOUS | Status: DC | PRN
  Filled 2019-02-17: qty 10

## 2019-02-17 MED ORDER — LIDOCAINE HCL (PF) 1 % IJ SOLN: 30.0000 mL | INTRAMUSCULAR | Status: DC | PRN

## 2019-02-17 MED ORDER — FENTANYL CITRATE (PF) 100 MCG/2ML IJ SOLN: 50.0000 ug | INTRAMUSCULAR | Status: DC | PRN

## 2019-02-17 MED ORDER — DIBUCAINE (PERIANAL) 1 % EX OINT: 1.0000 "application " | TOPICAL_OINTMENT | CUTANEOUS | Status: DC | PRN

## 2019-02-17 NOTE — Anesthesia Procedure Notes (Signed)
Epidural Patient location during procedure: OB Start time: 02/17/2019 2:50 PM End time: 02/17/2019 3:05 PM  Staffing Anesthesiologist: Elmer Picker, MD Performed: anesthesiologist   Preanesthetic Checklist Completed: patient identified, pre-op evaluation, timeout performed, IV checked, risks and benefits discussed and monitors and equipment checked  Epidural Patient position: sitting Prep: site prepped and draped and DuraPrep Patient monitoring: continuous pulse ox, blood pressure, heart rate and cardiac monitor Approach: midline Location: L3-L4 Injection technique: LOR air  Needle:  Needle type: Tuohy  Needle gauge: 17 G Needle length: 9 cm Needle insertion depth: 7 cm Catheter type: closed end flexible Catheter size: 19 Gauge Catheter at skin depth: 13 cm Test dose: negative  Assessment Sensory level: T8 Events: blood not aspirated, injection not painful, no injection resistance, negative IV test and no paresthesia  Additional Notes Patient identified. Risks/Benefits/Options discussed with patient including but not limited to bleeding, infection, nerve damage, paralysis, failed block, incomplete pain control, headache, blood pressure changes, nausea, vomiting, reactions to medication both or allergic, itching and postpartum back pain. Confirmed with bedside nurse the patient's most recent platelet count. Confirmed with patient that they are not currently taking any anticoagulation, have any bleeding history or any family history of bleeding disorders. Patient expressed understanding and wished to proceed. All questions were answered. Sterile technique was used throughout the entire procedure. Please see nursing notes for vital signs. Test dose was given through epidural catheter and negative prior to continuing to dose epidural or start infusion. Warning signs of high block given to the patient including shortness of breath, tingling/numbness in hands, complete motor block,  or any concerning symptoms with instructions to call for help. Patient was given instructions on fall risk and not to get out of bed. All questions and concerns addressed with instructions to call with any issues or inadequate analgesia.  Reason for block:procedure for pain

## 2019-02-17 NOTE — Progress Notes (Signed)
Patient ID: Victoria Lopez, female   DOB: 24-Apr-1991, 28 y.o.   MRN: [id number] Pt comfortable with epidural  afeb VSS FHR category 1  4/70/-1  IUPC placed to monitor pitocin Contractions not quite adequate so will increase pitocin to obtain adequate labor

## 2019-02-17 NOTE — Progress Notes (Signed)
Patient ID: Victoria Lopez, female   DOB: Nov 05, 1990, 28 y.o.   MRN: [id number] Pt feeling mild contractions  afeb VSS FHR category 1  Cervix 50/2-3/-2 AROM clear FSE applied  D/w pt getting epidural as soon as uncomfortable!

## 2019-02-17 NOTE — Anesthesia Preprocedure Evaluation (Signed)
Anesthesia Evaluation  Patient identified by MRN, date of birth, ID band Patient awake    Reviewed: Allergy & Precautions, NPO status , Patient's Chart, lab work & pertinent test results  Airway Mallampati: II  TM Distance: >3 FB Neck ROM: Full    Dental no notable dental hx.    Pulmonary neg pulmonary ROS,    Pulmonary exam normal breath sounds clear to auscultation       Cardiovascular negative cardio ROS Normal cardiovascular exam Rhythm:Regular Rate:Normal     Neuro/Psych negative neurological ROS  negative psych ROS   GI/Hepatic negative GI ROS, Neg liver ROS,   Endo/Other  negative endocrine ROS  Renal/GU negative Renal ROS  negative genitourinary   Musculoskeletal negative musculoskeletal ROS (+)   Abdominal   Peds negative pediatric ROS (+)  Hematology negative hematology ROS (+)   Anesthesia Other Findings   Reproductive/Obstetrics (+) Pregnancy                             Anesthesia Physical Anesthesia Plan  ASA: II  Anesthesia Plan: Epidural   Post-op Pain Management:    Induction:   PONV Risk Score and Plan: Treatment may vary due to age or medical condition  Airway Management Planned: Natural Airway  Additional Equipment:   Intra-op Plan:   Post-operative Plan:   Informed Consent: I have reviewed the patients History and Physical, chart, labs and discussed the procedure including the risks, benefits and alternatives for the proposed anesthesia with the patient or authorized representative who has indicated his/her understanding and acceptance.       Plan Discussed with: Anesthesiologist  Anesthesia Plan Comments: (Patient identified. Risks, benefits, options discussed with patient including but not limited to bleeding, infection, nerve damage, paralysis, failed block, incomplete pain control, headache, blood pressure changes, nausea, vomiting, reactions to  medication, itching, and post partum back pain. Confirmed with bedside nurse the patient's most recent platelet count. Confirmed with the patient that they are not taking any anticoagulation, have any bleeding history or any family history of bleeding disorders. Patient expressed understanding and wishes to proceed. All questions were answered. )        Anesthesia Quick Evaluation  

## 2019-02-18 LAB — CBC
HCT: 30.2 % — ABNORMAL LOW (ref 36.0–46.0)
Hemoglobin: 9.7 g/dL — ABNORMAL LOW (ref 12.0–15.0)
MCH: 28.1 pg (ref 26.0–34.0)
MCHC: 32.1 g/dL (ref 30.0–36.0)
MCV: 87.5 fL (ref 80.0–100.0)
Platelets: 144 10*3/uL — ABNORMAL LOW (ref 150–400)
RBC: 3.45 MIL/uL — ABNORMAL LOW (ref 3.87–5.11)
RDW: 14.6 % (ref 11.5–15.5)
WBC: 10.2 10*3/uL (ref 4.0–10.5)
nRBC: 0 % (ref 0.0–0.2)

## 2019-02-18 LAB — RPR: RPR Ser Ql: NONREACTIVE

## 2019-02-18 MED ORDER — IBUPROFEN 600 MG PO TABS: 600.0000 mg | ORAL_TABLET | Freq: Four times a day (QID) | ORAL | 0 refills | Status: DC

## 2019-02-18 NOTE — Discharge Summary (Signed)
OB Discharge Summary     Patient Name: Victoria Lopez DOB: [birth year] MRN: [id number]  Date of admission: 02/17/2019 Delivering MD: Huel Cote   Date of discharge: 02/18/2019  Admitting diagnosis: pregnancy Intrauterine pregnancy: [redacted]w[redacted]d     Secondary diagnosis:  Active Problems:   Indication for care in labor and delivery, antepartum   VBAC, delivered  Additional problems: none     Discharge diagnosis: Term Pregnancy Delivered and VBAC                                                                                                Post partum procedures:none  Augmentation: AROM and Pitocin  Complications: None  Hospital course:  Induction of Labor With Vaginal Delivery   28 y.o. yo [id number] at [redacted]w[redacted]d was admitted to the hospital 02/17/2019 for induction of labor.  Indication for induction: Favorable cervix at term.  Patient had an uncomplicated labor course as follows: Membrane Rupture Time/Date: 12:41 PM ,02/17/2019   Intrapartum Procedures: Episiotomy: None [1]                                         Lacerations:  Labial [10]  Patient had delivery of a Viable infant.  Information for the patient's newborn:  Victoria, Lopez [[id number]]  Delivery Method: VBAC, Spontaneous(Filed from Delivery Summary)   02/17/2019  Details of delivery can be found in separate delivery note.  Patient had a routine postpartum course. Patient is discharged home 02/18/19.  Physical exam  Vitals:   02/17/19 1945 02/17/19 2049 02/18/19 0048 02/18/19 0449  BP: 113/70 114/64 108/61 110/61  Pulse: 74 69 64 73  Resp: 17 18 17 18   Temp: 98.6 F (37 C) 98.9 F (37.2 C) 98.9 F (37.2 C) 98.9 F (37.2 C)  TempSrc: Oral Oral Oral Oral  SpO2: 100% 100% 100%   Weight:      Height:       General: alert and cooperative Lochia: appropriate Uterine Fundus: firm Incision: Dressing is clean, dry, and intact  Labs: Lab Results  Component Value Date   WBC 10.2 02/18/2019   HGB 9.7 (L)  02/18/2019   HCT 30.2 (L) 02/18/2019   MCV 87.5 02/18/2019   PLT 144 (L) 02/18/2019   CMP Latest Ref Rng & Units 11/21/2013  Glucose 70 - 99 mg/dL 85  BUN 6 - 23 mg/dL 9  Creatinine 1.54 - 0.08 mg/dL 6.76  Sodium 195 - 093 mEq/L 141  Potassium 3.7 - 5.3 mEq/L 3.8  Chloride 96 - 112 mEq/L 103  CO2 19 - 32 mEq/L 25  Calcium 8.4 - 10.5 mg/dL 8.8  Total Protein 6.0 - 8.3 g/dL 6.6  Total Bilirubin 0.3 - 1.2 mg/dL <2.6(Z)  Alkaline Phos 39 - 117 U/L 96  AST 0 - 37 U/L 16  ALT 0 - 35 U/L 15    Discharge instruction: per After Visit Summary and "Baby and Me Booklet".  After visit meds:  Allergies as of 02/18/2019   No Known Allergies  Medication List    TAKE these medications   acetaminophen 500 MG tablet Commonly known as:  TYLENOL Take 1,000 mg by mouth every 6 (six) hours as needed.   ibuprofen 600 MG tablet Commonly known as:  ADVIL Take 1 tablet (600 mg total) by mouth every 6 (six) hours. What changed:    when to take this  reasons to take this   prenatal multivitamin Tabs tablet Take 1 tablet by mouth daily at 12 noon.       Diet: routine diet  Activity: Advance as tolerated. Pelvic rest for 6 weeks.   Outpatient follow up:6 weeks Follow up Appt:No future appointments. Follow up Visit:No follow-ups on file.  Postpartum contraception: Tubal Ligation  Newborn Data: Live born female  Birth Weight: 7 lb 1.8 oz (3225 g) APGAR: 9, 9  Newborn Delivery   Birth date/time:  02/17/2019 17:19:00 Delivery type:  VBAC, Spontaneous     Baby Feeding: Breast Disposition:home with mother  Declines circumcision   02/18/2019 [name], MD

## 2019-02-18 NOTE — Lactation Note (Signed)
This note was copied from a baby's chart. Lactation Consultation Note  Patient Name: Victoria Lopez DHWYS'H Date: 02/18/2019 Reason for consult: Initial assessment;Term P4, 13 hour female infant, weight loss -3%. Mom feeding choice at admission was breast and formula feeding. Per mom, she feels infant is latching well, mom has breastfeed 3 x and supplemented with formula 3x . Mom breastfeed other 3 children for 2 weeks , she stopped due to  feeling she had low milk supply. LC discussed with mom breastfeeding more frequently and maybe offer less formula to establish milk supply. LC suggest mom to breastfeed at every feeding and then supplement with formula after breastfeeding infant at breast. Mom latched infant on left breast using cross cradle hold, infant latched with wide mouth gape, chin to breast, infant was still breastfeeding (10 minutes) when LC left the room. Per mom, she doesn't have a breast pump at home, [facility] gave mom the harmony hand pump, mom shown how to use hand pump  & how to disassemble, clean, & reassemble parts. Mom knows to breastfeed according hunger cues, 8 to 12 times within 24 hours, on demand. Mom knows to call Nurse or LC if she has any questions, concerns or need assistance with latching infant to breast. LC discussed I & O. Reviewed Baby & Me book's Breastfeeding Basics.  Mom made aware of O/P services, breastfeeding support groups, community resources, and our phone # for post-discharge questions.   Maternal Data Formula Feeding for Exclusion: Yes Reason for exclusion: Mother's choice to formula and breast feed on admission Has patient been taught Hand Expression?: Yes Does the patient have breastfeeding experience prior to this delivery?: Yes  Feeding Feeding Type: Breast Fed  LATCH Score Latch: Grasps breast easily, tongue down, lips flanged, rhythmical sucking.  Audible Swallowing: A few with stimulation  Type of Nipple: Everted at rest and after  stimulation  Comfort (Breast/Nipple): Soft / non-tender  Hold (Positioning): Assistance needed to correctly position infant at breast and maintain latch.  LATCH Score: 8  Interventions Interventions: Breast feeding basics reviewed;Assisted with latch;Skin to skin;Hand express;Breast compression;Adjust position;Support pillows;Position options;Hand pump  Lactation Tools Discussed/Used Tools: Pump Breast pump type: Manual WIC Program: No   Consult Status Consult Status: Follow-up Date: 02/19/19 Follow-up type: In-patient    Danelle Earthly 02/18/2019, 6:25 AM

## 2019-02-18 NOTE — Progress Notes (Signed)
Post Partum Day 1 Subjective: no complaints, up ad lib and tolerating PO  Requests early discharge  Objective: Blood pressure 110/61, pulse 73, temperature 98.9 F (37.2 C), temperature source Oral, resp. rate 18, height 5\' 2"  (1.575 m), weight 93.4 kg, last menstrual period 05/20/2018, SpO2 100 %, unknown if currently breastfeeding.  Physical Exam:  General: alert and cooperative Lochia: appropriate Uterine Fundus: firm   Recent Labs    02/17/19 0917 02/18/19 0545  HGB 10.3* 9.7*  HCT 31.2* 30.2*    Assessment/Plan: Discharge home if baby able to go   LOS: 1 day   Oliver Pila 02/18/2019, 9:22 AM

## 2019-02-18 NOTE — Lactation Note (Signed)
This note was copied from a baby's chart. Lactation Consultation Note  Patient Name: Victoria Lopez Date: 02/18/2019 Reason for consult: Follow-up assessment;Infant weight loss;Term  61 hours old FT female who is being partially BF and formula fed by his mother, she's a P4. Baby is at 3% weight loss, he's also on Similac 20 calorie formula. Mom and baby are going home today. Discussed discharge instructions, engorgement prevention and treatment, treatment/prevention for sore nipples and red flags on when to call your baby's pediatrician. Baby already nursing when entering the room, mom not doing STS at this time, but she said she has. LC assisted with latch to repositioned baby he was getting a little shallow. LC showed mom how deep baby should be at the breast in order to achieve a deep asymmetrical latch. Mom voiced understanding; baby fed for a total of 12 minutes with a few audible swallows heard upon breast compressions. Mom reported all questions and concerns were answered, she's aware of LC OP services and will contact PRN.  Maternal Data    Feeding Feeding Type: Breast Fed Nipple Type: (Newborn to sleepy.)  LATCH Score Latch: Grasps breast easily, tongue down, lips flanged, rhythmical sucking.  Audible Swallowing: A few with stimulation  Type of Nipple: Everted at rest and after stimulation  Comfort (Breast/Nipple): Soft / non-tender  Hold (Positioning): Assistance needed to correctly position infant at breast and maintain latch.  LATCH Score: 8  Interventions Interventions: Breast feeding basics reviewed;Assisted with latch;Breast massage;Breast compression;Adjust position  Lactation Tools Discussed/Used     Consult Status Consult Status: Complete Date: 02/19/19 Follow-up type: Call as needed    Anmol Paschen Venetia Constable 02/18/2019, 2:28 PM

## 2019-02-18 NOTE — Anesthesia Postprocedure Evaluation (Signed)
Anesthesia Post Note  Patient: Victoria Lopez  Procedure(s) Performed: AN AD HOC LABOR EPIDURAL     Patient location during evaluation: Mother Baby Anesthesia Type: Epidural Level of consciousness: awake and alert Pain management: pain level controlled Vital Signs Assessment: post-procedure vital signs reviewed and stable Respiratory status: spontaneous breathing, nonlabored ventilation and respiratory function stable Cardiovascular status: stable Postop Assessment: no headache, no backache and epidural receding Anesthetic complications: no Comments: Per telephone conversation    Last Vitals:  Vitals:   02/18/19 0048 02/18/19 0449  BP: 108/61 110/61  Pulse: 64 73  Resp: 17 18  Temp: 37.2 C 37.2 C  SpO2: 100%     Last Pain:  Vitals:   02/18/19 0555  TempSrc:   PainSc: 0-No pain   Pain Goal:                   Trellis Paganini

## 2019-04-18 ENCOUNTER — Other Ambulatory Visit (HOSPITAL_COMMUNITY): Payer: Medicaid Other

## 2019-04-18 ENCOUNTER — Other Ambulatory Visit (HOSPITAL_COMMUNITY)
Admission: RE | Admit: 2019-04-18 | Discharge: 2019-04-18 | Disposition: A | Discharge status: Home / Self Care | Payer: Medicaid Other | Source: Ambulatory Visit | Attending: Obstetrics and Gynecology | Admitting: Obstetrics and Gynecology

## 2019-04-18 ENCOUNTER — Encounter (HOSPITAL_COMMUNITY)
Admission: RE | Admit: 2019-04-18 | Discharge: 2019-04-18 | Disposition: A | Discharge status: Home / Self Care | Payer: Medicaid Other | Source: Ambulatory Visit | Attending: Obstetrics and Gynecology | Admitting: Obstetrics and Gynecology

## 2019-04-18 ENCOUNTER — Other Ambulatory Visit: Payer: Self-pay

## 2019-04-18 DIAGNOSIS — Z20828 Contact with and (suspected) exposure to other viral communicable diseases: Secondary | ICD-10-CM | POA: Diagnosis not present

## 2019-04-18 LAB — CBC
HCT: 37.7 % (ref 36.0–46.0)
Hemoglobin: 11.9 g/dL — ABNORMAL LOW (ref 12.0–15.0)
MCH: 28.4 pg (ref 26.0–34.0)
MCHC: 31.6 g/dL (ref 30.0–36.0)
MCV: 90 fL (ref 80.0–100.0)
Platelets: 232 10*3/uL (ref 150–400)
RBC: 4.19 MIL/uL (ref 3.87–5.11)
RDW: 16.3 % — ABNORMAL HIGH (ref 11.5–15.5)
WBC: 7.8 10*3/uL (ref 4.0–10.5)
nRBC: 0 % (ref 0.0–0.2)

## 2019-04-19 ENCOUNTER — Other Ambulatory Visit: Payer: Self-pay

## 2019-04-19 ENCOUNTER — Encounter (HOSPITAL_BASED_OUTPATIENT_CLINIC_OR_DEPARTMENT_OTHER): Payer: Self-pay | Admitting: *Deleted

## 2019-04-19 LAB — SARS CORONAVIRUS 2 (TAT 6-24 HRS): SARS Coronavirus 2: NEGATIVE

## 2019-04-19 NOTE — Progress Notes (Signed)
Spoke w/ pt via phone for pre-op interview.  Npo after mn.  Arrive at 0530.  Needs urine preg.  Current cbc result  in chart and chart.  Pt had covid test done yesterday.

## 2019-04-20 NOTE — H&P (Signed)
Victoria Lopez is an 28 y.o. female G6P4 who presents for scheduled laparoscopic sterilization for permanent sterility.  She is s/p VBAC in 5/20 and has been using condoms for contraception.     Pertinent Gynecological History:  OB History: NSVD x 4   Menstrual History:  Patient's last menstrual period was 03/29/2019 (approximate).    Past Medical History:  Diagnosis Date  . Anemia   . Wears glasses     Past Surgical History:  Procedure Laterality Date  . CESAREAN SECTION  07/2007    Family History  Problem Relation Age of Onset  . Diabetes Mother   . Hyperlipidemia Father   . Hypertension Sister   . Asthma Sister   . Alcohol abuse Paternal Grandfather   . Diabetes Maternal Aunt     Social History:  reports that she quit smoking about 2 years ago. Her smoking use included cigarettes. She quit after 2.00 years of use. She has never used smokeless tobacco. She reports current alcohol use. She reports that she does not use drugs.  Allergies: No Known Allergies  No medications prior to admission.    Review of Systems  Constitutional: Negative for fever.  Gastrointestinal: Negative for heartburn.    Height 5\' 2"  (1.575 m), weight 81.6 kg, last menstrual period 03/29/2019, not currently breastfeeding. Physical Exam  Constitutional: She appears well-developed.  Cardiovascular: Normal rate and regular rhythm.  Respiratory: Effort normal.  GI: Soft.  Genitourinary:    Uterus normal.   Neurological: She is alert.  Psychiatric: She has a normal mood and affect.    No results found for this or any previous visit (from the past 24 hour(s)).  No results found.  Assessment/Plan: d/w pt her laparoscopic tubal fulguration in detail. d/w her risks and benefits including bleeding, infection, and possible damage to bowel and bladder. We discussed that should a complication arise she might need a larger incision and have a much longer recovery. We discussed the risk of  failure of 1/100 with pregnancy occuring and an increased risk of ectopic pregnancy should she conceive after the surgery. Pt desires to proceed.  Logan Bores 04/20/2019, 8:25 PM

## 2019-04-21 ENCOUNTER — Ambulatory Visit (HOSPITAL_BASED_OUTPATIENT_CLINIC_OR_DEPARTMENT_OTHER): Payer: Medicaid Other | Admitting: Anesthesiology

## 2019-04-21 ENCOUNTER — Ambulatory Visit (HOSPITAL_BASED_OUTPATIENT_CLINIC_OR_DEPARTMENT_OTHER)
Admission: RE | Admit: 2019-04-21 | Discharge: 2019-04-21 | Disposition: A | Discharge status: Home / Self Care | Payer: Medicaid Other | Attending: Obstetrics and Gynecology | Admitting: Obstetrics and Gynecology

## 2019-04-21 ENCOUNTER — Other Ambulatory Visit: Payer: Self-pay

## 2019-04-21 ENCOUNTER — Encounter (HOSPITAL_BASED_OUTPATIENT_CLINIC_OR_DEPARTMENT_OTHER)
Admission: RE | Disposition: A | Discharge status: Home / Self Care | Payer: Self-pay | Source: Home / Self Care | Attending: Obstetrics and Gynecology

## 2019-04-21 ENCOUNTER — Ambulatory Visit (HOSPITAL_BASED_OUTPATIENT_CLINIC_OR_DEPARTMENT_OTHER): Payer: Medicaid Other | Admitting: Physician Assistant

## 2019-04-21 ENCOUNTER — Encounter (HOSPITAL_BASED_OUTPATIENT_CLINIC_OR_DEPARTMENT_OTHER): Payer: Self-pay

## 2019-04-21 DIAGNOSIS — N8312 Corpus luteum cyst of left ovary: Secondary | ICD-10-CM | POA: Diagnosis not present

## 2019-04-21 DIAGNOSIS — Z87891 Personal history of nicotine dependence: Secondary | ICD-10-CM | POA: Diagnosis not present

## 2019-04-21 DIAGNOSIS — Z302 Encounter for sterilization: Secondary | ICD-10-CM | POA: Insufficient documentation

## 2019-04-21 HISTORY — DX: Other complications of anesthesia, initial encounter: T88.59XA

## 2019-04-21 HISTORY — PX: LAPAROSCOPIC BILATERAL SALPINGECTOMY: SHX5889

## 2019-04-21 HISTORY — DX: Anemia, unspecified: D64.9

## 2019-04-21 HISTORY — DX: Presence of spectacles and contact lenses: Z97.3

## 2019-04-21 LAB — POCT PREGNANCY, URINE: Preg Test, Ur: NEGATIVE

## 2019-04-21 SURGERY — SALPINGECTOMY, BILATERAL, LAPAROSCOPIC
Anesthesia: General | Laterality: Bilateral

## 2019-04-21 MED ORDER — LIDOCAINE 2% (20 MG/ML) 5 ML SYRINGE
INTRAMUSCULAR | Status: AC
  Filled 2019-04-21: qty 5

## 2019-04-21 MED ORDER — PROPOFOL 10 MG/ML IV BOLUS
INTRAVENOUS | Status: DC | PRN
  Administered 2019-04-21: 180 mg via INTRAVENOUS

## 2019-04-21 MED ORDER — SUGAMMADEX SODIUM 200 MG/2ML IV SOLN
INTRAVENOUS | Status: DC | PRN
  Administered 2019-04-21: 200 mg via INTRAVENOUS

## 2019-04-21 MED ORDER — PROPOFOL 10 MG/ML IV BOLUS
INTRAVENOUS | Status: AC
  Filled 2019-04-21: qty 20

## 2019-04-21 MED ORDER — HYDROCODONE-ACETAMINOPHEN 5-325 MG PO TABS: 1.0000 | ORAL_TABLET | Freq: Four times a day (QID) | ORAL | 0 refills | Status: DC | PRN

## 2019-04-21 MED ORDER — LACTATED RINGERS IV SOLN
INTRAVENOUS | Status: DC
  Administered 2019-04-21: 1000 mL via INTRAVENOUS
  Filled 2019-04-21: qty 1000

## 2019-04-21 MED ORDER — ONDANSETRON HCL 4 MG/2ML IJ SOLN
INTRAMUSCULAR | Status: DC | PRN
  Administered 2019-04-21: 4 mg via INTRAVENOUS

## 2019-04-21 MED ORDER — LIDOCAINE HCL (CARDIAC) PF 100 MG/5ML IV SOSY
PREFILLED_SYRINGE | INTRAVENOUS | Status: DC | PRN
  Administered 2019-04-21: 40 mg via INTRAVENOUS

## 2019-04-21 MED ORDER — MIDAZOLAM HCL 5 MG/5ML IJ SOLN
INTRAMUSCULAR | Status: DC | PRN
  Administered 2019-04-21: 2 mg via INTRAVENOUS

## 2019-04-21 MED ORDER — FENTANYL CITRATE (PF) 100 MCG/2ML IJ SOLN
INTRAMUSCULAR | Status: AC
  Filled 2019-04-21: qty 2

## 2019-04-21 MED ORDER — DEXAMETHASONE SODIUM PHOSPHATE 10 MG/ML IJ SOLN
INTRAMUSCULAR | Status: AC
  Filled 2019-04-21: qty 1

## 2019-04-21 MED ORDER — ROCURONIUM BROMIDE 10 MG/ML (PF) SYRINGE
PREFILLED_SYRINGE | INTRAVENOUS | Status: AC
  Filled 2019-04-21: qty 10

## 2019-04-21 MED ORDER — DIPHENHYDRAMINE HCL 50 MG/ML IJ SOLN
INTRAMUSCULAR | Status: AC
  Filled 2019-04-21: qty 1

## 2019-04-21 MED ORDER — BUPIVACAINE HCL (PF) 0.25 % IJ SOLN
INTRAMUSCULAR | Status: DC | PRN
  Administered 2019-04-21: 12 mL

## 2019-04-21 MED ORDER — FENTANYL CITRATE (PF) 100 MCG/2ML IJ SOLN
INTRAMUSCULAR | Status: DC | PRN
  Administered 2019-04-21 (×2): 50 ug via INTRAVENOUS
  Administered 2019-04-21: 25 ug via INTRAVENOUS

## 2019-04-21 MED ORDER — ROCURONIUM BROMIDE 100 MG/10ML IV SOLN
INTRAVENOUS | Status: DC | PRN
  Administered 2019-04-21: 50 mg via INTRAVENOUS

## 2019-04-21 MED ORDER — ONDANSETRON HCL 4 MG/2ML IJ SOLN
INTRAMUSCULAR | Status: AC
  Filled 2019-04-21: qty 2

## 2019-04-21 MED ORDER — DEXAMETHASONE SODIUM PHOSPHATE 10 MG/ML IJ SOLN
INTRAMUSCULAR | Status: DC | PRN
  Administered 2019-04-21: 5 mg via INTRAVENOUS

## 2019-04-21 MED ORDER — LACTATED RINGERS IV SOLN
INTRAVENOUS | Status: DC
  Administered 2019-04-21: 09:00:00 via INTRAVENOUS
  Filled 2019-04-21: qty 1000

## 2019-04-21 MED ORDER — MIDAZOLAM HCL 2 MG/2ML IJ SOLN
INTRAMUSCULAR | Status: AC
  Filled 2019-04-21: qty 2

## 2019-04-21 SURGICAL SUPPLY — 29 items
CABLE HIGH FREQUENCY MONO STRZ (ELECTRODE) IMPLANT
CATH ROBINSON RED A/P 16FR (CATHETERS) ×2 IMPLANT
COVER WAND RF STERILE (DRAPES) ×2 IMPLANT
DERMABOND ADVANCED (GAUZE/BANDAGES/DRESSINGS) ×1
DERMABOND ADVANCED .7 DNX12 (GAUZE/BANDAGES/DRESSINGS) ×1 IMPLANT
DRSG COVADERM PLUS 2X2 (GAUZE/BANDAGES/DRESSINGS) IMPLANT
DRSG OPSITE POSTOP 3X4 (GAUZE/BANDAGES/DRESSINGS) ×2 IMPLANT
DURAPREP 26ML APPLICATOR (WOUND CARE) ×2 IMPLANT
GAUZE 4X4 16PLY RFD (DISPOSABLE) ×2 IMPLANT
GLOVE BIO SURGEON STRL SZ7 (GLOVE) ×4 IMPLANT
GOWN STRL REUS W/TWL LRG LVL3 (GOWN DISPOSABLE) ×2 IMPLANT
NEEDLE INSUFFLATION 120MM (ENDOMECHANICALS) ×2 IMPLANT
NS IRRIG 500ML POUR BTL (IV SOLUTION) ×2 IMPLANT
PACK LAPAROSCOPY BASIN (CUSTOM PROCEDURE TRAY) ×2 IMPLANT
PACK TRENDGUARD 450 HYBRID PRO (MISCELLANEOUS) ×1 IMPLANT
PROTECTOR NERVE ULNAR (MISCELLANEOUS) ×4 IMPLANT
SET IRRIG TUBING LAPAROSCOPIC (IRRIGATION / IRRIGATOR) IMPLANT
SHEARS HARMONIC ACE PLUS 36CM (ENDOMECHANICALS) IMPLANT
SUT VIC AB 4-0 P2 18 (SUTURE) ×2 IMPLANT
SUT VICRYL 0 UR6 27IN ABS (SUTURE) IMPLANT
SYS BAG RETRIEVAL 10MM (BASKET)
SYSTEM BAG RETRIEVAL 10MM (BASKET) IMPLANT
TOWEL OR 17X26 10 PK STRL BLUE (TOWEL DISPOSABLE) ×4 IMPLANT
TRENDGUARD 450 HYBRID PRO PACK (MISCELLANEOUS) ×2
TROCAR BALLN 12MMX100 BLUNT (TROCAR) IMPLANT
TROCAR BLADELESS OPT 5 100 (ENDOMECHANICALS) ×4 IMPLANT
TROCAR XCEL NON-BLD 11X100MML (ENDOMECHANICALS) ×2 IMPLANT
TUBING EVAC SMOKE HEATED PNEUM (TUBING) ×2 IMPLANT
WARMER LAPAROSCOPE (MISCELLANEOUS) ×2 IMPLANT

## 2019-04-21 NOTE — Transfer of Care (Signed)
Immediate Anesthesia Transfer of Care Note  Patient: Victoria Lopez  Procedure(s) Performed: LAPAROSCOPIC BILATERAL SALPINGECTOMY (Bilateral )  Patient Location: PACU  Anesthesia Type:General  Level of Consciousness: awake, alert , oriented and patient cooperative  Airway & Oxygen Therapy: Patient Spontanous Breathing and Patient connected to nasal cannula oxygen  Post-op Assessment: Report given to RN and Post -op Vital signs reviewed and stable  Post vital signs: Reviewed and stable  Last Vitals:  Vitals Value Taken Time  BP 133/82 04/21/19 0830  Temp    Pulse 116 04/21/19 0831  Resp 15 04/21/19 0831  SpO2 100 % 04/21/19 0831  Vitals shown include unvalidated device data.  Last Pain:  Vitals:   04/21/19 0604  TempSrc:   PainSc: 0-No pain      Patients Stated Pain Goal: 7 (16/10/96 0454)  Complications: No apparent anesthesia complications

## 2019-04-21 NOTE — Anesthesia Postprocedure Evaluation (Signed)
Anesthesia Post Note  Patient: Victoria Lopez  Procedure(s) Performed: LAPAROSCOPIC BILATERAL SALPINGECTOMY (Bilateral )     Patient location during evaluation: PACU Anesthesia Type: General Level of consciousness: awake and alert Pain management: pain level controlled Vital Signs Assessment: post-procedure vital signs reviewed and stable Respiratory status: spontaneous breathing, nonlabored ventilation, respiratory function stable and patient connected to nasal cannula oxygen Cardiovascular status: blood pressure returned to baseline and stable Postop Assessment: no apparent nausea or vomiting Anesthetic complications: no    Last Vitals:  Vitals:   04/21/19 0930 04/21/19 0958  BP: 112/79   Pulse: (!) 56 (!) 56  Resp: 12 16  Temp:  36.6 C  SpO2: 100% 100%    Last Pain:  Vitals:   04/21/19 0930  TempSrc:   PainSc: 0-No pain                 Christain Niznik S

## 2019-04-21 NOTE — Anesthesia Procedure Notes (Signed)
Procedure Name: Intubation Date/Time: 04/21/2019 7:33 AM Performed by: Garrel Ridgel, CRNA Pre-anesthesia Checklist: Patient identified, Emergency Drugs available, Suction available, Patient being monitored and Timeout performed Patient Re-evaluated:Patient Re-evaluated prior to induction Oxygen Delivery Method: Circle system utilized Preoxygenation: Pre-oxygenation with 100% oxygen Induction Type: IV induction Ventilation: Mask ventilation without difficulty Laryngoscope Size: Mac and 3 Grade View: Grade I Tube type: Oral Tube size: 7.0 mm Number of attempts: 1 Airway Equipment and Method: Stylet Placement Confirmation: ETT inserted through vocal cords under direct vision,  positive ETCO2 and breath sounds checked- equal and bilateral Secured at: 21 cm Tube secured with: Tape Dental Injury: Teeth and Oropharynx as per pre-operative assessment

## 2019-04-21 NOTE — Progress Notes (Signed)
Patient ID: Victoria Lopez, female   DOB: 09-28-1990, 28 y.o.   MRN: [id number] Per pt no changes in dictated H&P.  Brief exam WNL.  Ready to proceed

## 2019-04-21 NOTE — Op Note (Signed)
Operative Note    Preoperative Diagnosis Desires permanent sterility  Postoperative Diagnosis Desires permanent sterility  Procedure Laparoscopic bilateral tubal fulguration  Surgeon Paula Compton, MD Burley Saver, RNFA  Anesthesia GETA  Fluids: EBL minimal UOP 106mL straight cath prior to procedure IVF 741mL LR  Findings Normal uterus ovaries and tubes. Left ovary with CL cyst.  Normal appendix and gall bladder  Specimen None  Procedure Note Patient was taken to the operating room where general anesthesia was obtained without difficulty. She was then prepped and draped in the normal sterile fashion in the dorsal lithotomy position. An appropriate timeout was performed. A speculum was then placed within the vagina and a Hulka tenaculum placed within the cervix for uterine manipulation. The bladder was emptied. Attention was then turned to the patient's abdomen after draping where the infraumbilical area was injected with approximately 10 cc of quarter percent Marcaine. A 1 cm incision was then made within the umbilicus and the varies needle easily introduced into the peritoneal cavity. Intraperitoneal placement was confirmed by aspiration and injection with normal saline. Gas flow was then applied and a pneumoperitoneum obtained with approximate 3 L of CO2 gas. The varies needle was then removed and the 11 mm trocar was easily introduced into the abdomen. With patient in Trendelenburg the uterus and tubes and ovaries were inspected with findings as previously stated. A bipolar cautery was then introduced through the operative scope and the right fallopian tube grasped approximately 3 cm from the uterine cornua and cauterized in 2 sequential areas with good blanching noted. Attention was then turned to the left fallopian tube which was likewise grasped proximally 3 cm from the uterine cornua and cauterized into sequential spots with good blanching noted there as well. The remainder  of the pelvis and abdomen were inspected and found to be normal. The instruments were removed from the abdomen and the pneumoperitoneum reduced through the trocar. The trocar was finally removed and the infraumbilical incision closed with one deep stitch of 0 Vicryl and a subcuticular stitch of 3-0 Vicryl. Dermabond and a bandage were placed. Patient was then awakened and taken to the recovery room in good condition.

## 2019-04-21 NOTE — Anesthesia Preprocedure Evaluation (Signed)
Anesthesia Evaluation  Patient identified by MRN, date of birth, ID band Patient awake    Reviewed: Allergy & Precautions, H&P , NPO status , Patient's Chart, lab work & pertinent test results  Airway Mallampati: II   Neck ROM: full    Dental   Pulmonary former smoker,    breath sounds clear to auscultation       Cardiovascular negative cardio ROS   Rhythm:regular Rate:Normal     Neuro/Psych    GI/Hepatic   Endo/Other  obese  Renal/GU      Musculoskeletal   Abdominal   Peds  Hematology   Anesthesia Other Findings   Reproductive/Obstetrics                             Anesthesia Physical Anesthesia Plan  ASA: II  Anesthesia Plan: General   Post-op Pain Management:    Induction: Intravenous  PONV Risk Score and Plan: 3 and Ondansetron, Dexamethasone, Midazolam, Treatment may vary due to age or medical condition and Scopolamine patch - Pre-op  Airway Management Planned: Oral ETT  Additional Equipment:   Intra-op Plan:   Post-operative Plan: Extubation in OR  Informed Consent: I have reviewed the patients History and Physical, chart, labs and discussed the procedure including the risks, benefits and alternatives for the proposed anesthesia with the patient or authorized representative who has indicated his/her understanding and acceptance.       Plan Discussed with: CRNA, Anesthesiologist and Surgeon  Anesthesia Plan Comments:         Anesthesia Quick Evaluation

## 2019-04-21 NOTE — Discharge Instructions (Signed)

## 2019-04-24 ENCOUNTER — Encounter (HOSPITAL_BASED_OUTPATIENT_CLINIC_OR_DEPARTMENT_OTHER): Payer: Self-pay | Admitting: Obstetrics and Gynecology

## 2019-10-20 ENCOUNTER — Other Ambulatory Visit: Payer: Self-pay

## 2019-10-20 ENCOUNTER — Encounter (HOSPITAL_COMMUNITY): Payer: Self-pay

## 2019-10-20 ENCOUNTER — Ambulatory Visit (HOSPITAL_COMMUNITY)
Admission: EM | Admit: 2019-10-20 | Discharge: 2019-10-20 | Disposition: A | Discharge status: Home / Self Care | Payer: Medicaid Other

## 2019-10-20 DIAGNOSIS — R200 Anesthesia of skin: Secondary | ICD-10-CM

## 2019-10-20 DIAGNOSIS — M549 Dorsalgia, unspecified: Secondary | ICD-10-CM | POA: Diagnosis not present

## 2019-10-20 DIAGNOSIS — R202 Paresthesia of skin: Secondary | ICD-10-CM

## 2019-10-20 MED ORDER — NAPROXEN 500 MG PO TABS: 500.0000 mg | ORAL_TABLET | Freq: Two times a day (BID) | ORAL | 0 refills | Status: DC

## 2019-10-20 MED ORDER — CYCLOBENZAPRINE HCL 5 MG PO TABS: 5.0000 mg | ORAL_TABLET | Freq: Every day | ORAL | 0 refills | Status: DC

## 2019-10-20 NOTE — ED Triage Notes (Signed)
Pt c/o shoulder and neck pain on right side x 10 days; states pain radiates to rt arm. Also states some mild tingling to fingers right hand.  Denies: dizziness, slurred speech, n/v, or difficulty ambulating. Denies injury to area. States works on [employer] and phone long hours.  Neurovascular intact.

## 2019-10-20 NOTE — ED Provider Notes (Signed)
[facility]    CSN: [id number] Arrival date & time: 10/20/19  1443      History   Chief Complaint Chief Complaint  Patient presents with  . Neck Pain  . Shoulder Pain    HPI Victoria Lopez is a 29 y.o. female.   Victoria Lopez presents with complaints of upper back pain as well as some right shoulder pain, as well as right elbow pain which is intermittent and tingling/cold sensation which is intermittent to right hand, worse at right pinky finger. She works at [employer] and receiving calls regularly, has had more work recently. Driving triggers her hand symptoms and extending her elbow while driving helps. She is right handed. No specific known injury. Denies any previous similar. Started approximately 1.5 week ago. Has taken tylenol which does help. No swelling or redness.   ROS per HPI, negative if not otherwise mentioned.      Past Medical History:  Diagnosis Date  . Anemia   . Complication of anesthesia    BP dropped into 70s with last anesthesia, asymptomatic  . Wears glasses     Patient Active Problem List   Diagnosis Date Noted  . Indication for care in labor and delivery, antepartum 02/17/2019  . VBAC, delivered 02/17/2019  . Normal pregnancy in third trimester 02/08/2017  . Active labor 03/04/2014  . VBAC, delivered, current hospitalization 03/04/2014  . SAB (spontaneous abortion) 12/13/2012    Past Surgical History:  Procedure Laterality Date  . CESAREAN SECTION  07/2007  . LAPAROSCOPIC BILATERAL SALPINGECTOMY Bilateral 04/21/2019   Procedure: LAPAROSCOPIC BILATERAL SALPINGECTOMY;  Surgeon: Huel Cote, MD;  Location: [facility];  Service: Gynecology;  Laterality: Bilateral;    OB History    Gravida  7   Para  4   Term  4   Preterm      AB  2   Living  4     SAB  2   TAB      Ectopic      Multiple  0   Live Births  4            Home Medications    Prior to Admission medications     Medication Sig Start Date End Date Taking? Authorizing Provider  phentermine 37.5 MG capsule Take 37.5 mg by mouth every morning.   Yes [provider]  cyclobenzaprine (FLEXERIL) 5 MG tablet Take 1 tablet (5 mg total) by mouth at bedtime. 10/20/19   Georgetta Haber, NP  HYDROcodone-acetaminophen (NORCO) 5-325 MG tablet Take 1 tablet by mouth every 6 (six) hours as needed for moderate pain. 04/21/19   Huel Cote, MD  ibuprofen (ADVIL) 600 MG tablet Take 1 tablet (600 mg total) by mouth every 6 (six) hours. Patient not taking: Reported on 04/19/2019 02/18/19   Huel Cote, MD  naproxen (NAPROSYN) 500 MG tablet Take 1 tablet (500 mg total) by mouth 2 (two) times daily. 10/20/19   Georgetta Haber, NP  Prenatal Vit-Fe Fumarate-FA (PRENATAL MULTIVITAMIN) TABS tablet Take 1 tablet by mouth daily at 12 noon.    [provider]    Family History Family History  Problem Relation Age of Onset  . Diabetes Mother   . Hyperlipidemia Father   . Hypertension Sister   . Asthma Sister   . Alcohol abuse Paternal Grandfather   . Diabetes Maternal Aunt     Social History Social History   Tobacco Use  . Smoking status: Former Smoker  Years: 2.00    Types: Cigarettes    Quit date: 04/18/2017    Years since quitting: 2.5  . Smokeless tobacco: Never Used  Substance Use Topics  . Alcohol use: Yes    Comment: occasional  . Drug use: Never     Allergies   Patient has no known allergies.   Review of Systems Review of Systems   Physical Exam Triage Vital Signs ED Triage Vitals  Enc Vitals Group     BP 10/20/19 1529 120/66     Pulse Rate 10/20/19 1529 72     Resp 10/20/19 1529 16     Temp 10/20/19 1529 98 F (36.7 C)     Temp Source 10/20/19 1529 Oral     SpO2 10/20/19 1529 99 %     Weight --      Height --      Head Circumference --      Peak Flow --      Pain Score 10/20/19 1527 5     Pain Loc --      Pain Edu? --      Excl. in [place]? --    No data  found.  Updated Vital Signs BP 120/66 (BP Location: Right Arm)   Pulse 72   Temp 98 F (36.7 C) (Oral)   Resp 16   LMP 09/07/2019   SpO2 99%    Physical Exam Constitutional:      General: She is not in acute distress.    Appearance: She is well-developed.  Cardiovascular:     Rate and Rhythm: Normal rate.  Pulmonary:     Effort: Pulmonary effort is normal.  Musculoskeletal:     Thoracic back: Tenderness present. No bony tenderness. Normal range of motion.       Back:     Comments: Proximal thoracic/ distal cervical musculature bilaterally with tenderness; full rom of neck and arms/shoulders without limitation; no spinous process tenderness; negative phalen and tinel but some increased right hand pinky tingling with phalen; strong radial pulse; cap refill < 2 seconds; no wrist or elbow tenderness on palpation and with full ROM   Skin:    General: Skin is warm and dry.  Neurological:     Mental Status: She is alert and oriented to person, place, and time.      UC Treatments / Results  Labs (all labs ordered are listed, but only abnormal results are displayed) Labs Reviewed - No data to display  EKG   Radiology No results found.  Procedures Procedures (including critical care time)  Medications Ordered in UC Medications - No data to display  Initial Impression / Assessment and Plan / UC Course  I have reviewed the triage vital signs and the nursing notes.  Pertinent labs & imaging results that were available during my care of the patient were reviewed by me and considered in my medical decision making (see chart for details).     Suspect repetitive typing/mouse work and sitting at desk contributing to muscle spasm and cubital vs carpal tunnel symtpoms. Nsaids. Flexeril at night. Wrist brace provided. Encouraged straight elbow at night. Ergonomics discussed. Follow ups recommended as well. Patient verbalized understanding and agreeable to plan.   Final Clinical  Impressions(s) / UC Diagnoses   Final diagnoses:  Upper back pain  Numbness and tingling in right hand     Discharge Instructions     Light and regular stretching of your chest and upper back.  Heat, massage to the upper  back.  Try to  keep right arm straight while sleeping, may wrap a towel around elbow to keep elbow straight, this may also help with the tingling sensation of your hand. Use of wrist brace while working and with flexion of the wrist.  Naproxen twice a day to help with pain, take with food.  Flexeril at night as needed.  Follow up with sports medicine as needed if symptoms persist.    ED Prescriptions    Medication Sig Dispense Auth. Provider   naproxen (NAPROSYN) 500 MG tablet Take 1 tablet (500 mg total) by mouth 2 (two) times daily. 30 tablet Linus Mako B, NP   cyclobenzaprine (FLEXERIL) 5 MG tablet Take 1 tablet (5 mg total) by mouth at bedtime. 15 tablet Georgetta Haber, NP     PDMP not reviewed this encounter.   Georgetta Haber, NP 10/20/19 [phone]

## 2019-10-20 NOTE — Discharge Instructions (Signed)
Light and regular stretching of your chest and upper back.  Heat, massage to the upper back.  Try to  keep right arm straight while sleeping, may wrap a towel around elbow to keep elbow straight, this may also help with the tingling sensation of your hand. Use of wrist brace while working and with flexion of the wrist.  Naproxen twice a day to help with pain, take with food.  Flexeril at night as needed.  Follow up with sports medicine as needed if symptoms persist.

## 2019-12-29 ENCOUNTER — Emergency Department (HOSPITAL_COMMUNITY)
Admission: EM | Admit: 2019-12-29 | Discharge: 2019-12-29 | Disposition: A | Discharge status: Home / Self Care | Payer: Medicaid Other | Attending: Emergency Medicine | Admitting: Emergency Medicine

## 2019-12-29 ENCOUNTER — Encounter (HOSPITAL_COMMUNITY): Payer: Self-pay

## 2019-12-29 ENCOUNTER — Other Ambulatory Visit: Payer: Self-pay

## 2019-12-29 ENCOUNTER — Emergency Department (HOSPITAL_COMMUNITY): Payer: Medicaid Other

## 2019-12-29 DIAGNOSIS — Z79899 Other long term (current) drug therapy: Secondary | ICD-10-CM | POA: Diagnosis not present

## 2019-12-29 DIAGNOSIS — R05 Cough: Secondary | ICD-10-CM | POA: Diagnosis present

## 2019-12-29 DIAGNOSIS — Z87891 Personal history of nicotine dependence: Secondary | ICD-10-CM | POA: Diagnosis not present

## 2019-12-29 DIAGNOSIS — U071 COVID-19: Secondary | ICD-10-CM | POA: Diagnosis not present

## 2019-12-29 LAB — POC URINE PREG, ED: Preg Test, Ur: NEGATIVE

## 2019-12-29 MED ORDER — DEXAMETHASONE SODIUM PHOSPHATE 10 MG/ML IJ SOLN
10.0000 mg | Freq: Once | INTRAMUSCULAR | Status: AC
  Administered 2019-12-29: 20:00:00 10 mg via INTRAMUSCULAR
  Filled 2019-12-29: qty 1

## 2019-12-29 NOTE — ED Provider Notes (Signed)
28yo female with COVID +, day 8 of symptoms, not feeling better. No SHOB, no hypoxemia. Awaiting upreg for decadron. Expect dc home, pending CXR.  Physical Exam  BP 100/60   Pulse 85   Temp 98.4 F (36.9 C) (Oral)   Resp (!) 21   SpO2 97%   Physical Exam  ED Course/Procedures     Procedures  MDM  Chest x-ray consistent with Covid, multifocal pneumonia.  Patient will be given dose of Decadron (negative pregnancy test).  Referred to Covid post clinic.       Jeannie Fend, PA-C 12/29/19 1949    Pricilla Loveless, MD 01/01/20 [phone]

## 2019-12-29 NOTE — Discharge Instructions (Signed)
You can take Tylenol or Ibuprofen as directed for pain. You can alternate Tylenol and Ibuprofen every 4 hours. If you take Tylenol at 1pm, then you can take Ibuprofen at 5pm. Then you can take Tylenol again at 9pm.   Make sure you are staying hydrated drinking plenty of fluids.  Your symptoms are consistent with COVID-19.  He will expect to feel weak, muscle aches for several days.  Return emergency department for any inability eat or drink, difficulty breathing or any other worsening concerning symptoms.

## 2019-12-29 NOTE — ED Notes (Signed)
Discharge instructions discussed with pt including follow up with COVID Clinic. Pt verbalized understanding with no other questions at this time.

## 2019-12-29 NOTE — ED Provider Notes (Signed)
[facility] EMERGENCY DEPARTMENT Provider Note   CSN: [id number] Arrival date & time: 12/29/19  1725     History Chief Complaint  Patient presents with  . covid+/cough    [facility] is a 29 y.o. female who presents for evaluation of cough, fatigue, headache, generalized body aches that have been ongoing for last 8 days.  She reports that she recently tested positive for Covid 5 days ago.  She reports that her symptoms started 3 days prior to that.  She comes in today because she does not feel like she is getting any better.  She reports that she takes Tylenol intermittently and that temporarily relieves the headache but then it returns.  She has also had a cough.  She states cough is productive of yellow phlegm.  No hemoptysis.  She states that she has had some chest soreness associated with coughing.  She also reports that she will get into a coughing fit and feeling like she has trouble catching her breath.  No other associated chest pain or cough.  She states she has had fever at home but has not measured a temperature she has just felt like a fever.  She reports that she has felt tired and fatigued.  She does not smoke.  No history of asthma or COPD.  She denies any abdominal pain, nausea/vomiting, numbness/weakness of arms or legs.  The history is provided by the patient.       Past Medical History:  Diagnosis Date  . Anemia   . Complication of anesthesia    BP dropped into 70s with last anesthesia, asymptomatic  . Wears glasses     Patient Active Problem List   Diagnosis Date Noted  . Indication for care in labor and delivery, antepartum 02/17/2019  . VBAC, delivered 02/17/2019  . Normal pregnancy in third trimester 02/08/2017  . Active labor 03/04/2014  . VBAC, delivered, current hospitalization 03/04/2014  . SAB (spontaneous abortion) 12/13/2012    Past Surgical History:  Procedure Laterality Date  . CESAREAN SECTION  07/2007  . LAPAROSCOPIC  BILATERAL SALPINGECTOMY Bilateral 04/21/2019   Procedure: LAPAROSCOPIC BILATERAL SALPINGECTOMY;  Surgeon: Huel Cote, MD;  Location: [facility];  Service: Gynecology;  Laterality: Bilateral;     OB History    Gravida  7   Para  4   Term  4   Preterm      AB  2   Living  4     SAB  2   TAB      Ectopic      Multiple  0   Live Births  4           Family History  Problem Relation Age of Onset  . Diabetes Mother   . Hyperlipidemia Father   . Hypertension Sister   . Asthma Sister   . Alcohol abuse Paternal Grandfather   . Diabetes Maternal Aunt     Social History   Tobacco Use  . Smoking status: Former Smoker    Years: 2.00    Types: Cigarettes    Quit date: 04/18/2017    Years since quitting: 2.6  . Smokeless tobacco: Never Used  Substance Use Topics  . Alcohol use: Yes    Comment: occasional  . Drug use: Never    Home Medications Prior to Admission medications   Medication Sig Start Date End Date Taking? Authorizing Provider  cyclobenzaprine (FLEXERIL) 5 MG tablet Take 1 tablet (5 mg total) by mouth  at bedtime. 10/20/19   Georgetta Haber, NP  HYDROcodone-acetaminophen (NORCO) 5-325 MG tablet Take 1 tablet by mouth every 6 (six) hours as needed for moderate pain. 04/21/19   Huel Cote, MD  ibuprofen (ADVIL) 600 MG tablet Take 1 tablet (600 mg total) by mouth every 6 (six) hours. Patient not taking: Reported on 04/19/2019 02/18/19   Huel Cote, MD  naproxen (NAPROSYN) 500 MG tablet Take 1 tablet (500 mg total) by mouth 2 (two) times daily. 10/20/19   Linus Mako B, NP  phentermine 37.5 MG capsule Take 37.5 mg by mouth every morning.    [provider]  Prenatal Vit-Fe Fumarate-FA (PRENATAL MULTIVITAMIN) TABS tablet Take 1 tablet by mouth daily at 12 noon.    [provider]    Allergies    Patient has no known allergies.  Review of Systems   Review of Systems  Constitutional: Positive for fatigue  and fever.  Respiratory: Positive for cough. Negative for shortness of breath.   Cardiovascular: Negative for chest pain.  Gastrointestinal: Negative for abdominal pain, nausea and vomiting.  Musculoskeletal: Positive for myalgias.  Neurological: Positive for headaches. Negative for numbness.  All other systems reviewed and are negative.   Physical Exam Updated Vital Signs BP 100/69   Pulse 84   Temp 98.8 F (37.1 C) (Oral)   Resp 18   SpO2 96%   Physical Exam Vitals and nursing note reviewed.  Constitutional:      Appearance: Normal appearance. She is well-developed.  HENT:     Head: Normocephalic and atraumatic.  Eyes:     General: Lids are normal.     Conjunctiva/sclera: Conjunctivae normal.     Pupils: Pupils are equal, round, and reactive to light.  Neck:     Comments: Neck is supple and without rigidity.  Cardiovascular:     Rate and Rhythm: Normal rate and regular rhythm.     Pulses: Normal pulses.     Heart sounds: Normal heart sounds. No murmur. No friction rub. No gallop.   Pulmonary:     Effort: Pulmonary effort is normal.     Breath sounds: Normal breath sounds.     Comments: Intermittently coughing.  Able to speak in full sentences with any difficulty.  No evidence of respiratory distress.  No rales. Abdominal:     Palpations: Abdomen is soft. Abdomen is not rigid.     Tenderness: There is no abdominal tenderness. There is no guarding.  Musculoskeletal:        General: Normal range of motion.     Cervical back: Full passive range of motion without pain and neck supple.  Skin:    General: Skin is warm and dry.     Capillary Refill: Capillary refill takes less than 2 seconds.  Neurological:     Mental Status: She is alert and oriented to person, place, and time.     Comments: Cranial nerves III-XII intact Follows commands, Moves all extremities  5/5 strength to BUE and BLE  Sensation intact throughout all major nerve distributions No slurred speech. No  facial droop.   Psychiatric:        Speech: Speech normal.     ED Results / Procedures / Treatments   Labs (all labs ordered are listed, but only abnormal results are displayed) Labs Reviewed  POC URINE PREG, ED    EKG None  Radiology No results found.  Procedures Procedures (including critical care time)  Medications Ordered in ED Medications  dexamethasone (DECADRON)  injection 10 mg (has no administration in time range)    ED Course  I have reviewed the triage vital signs and the nursing notes.  Pertinent labs & imaging results that were available during my care of the patient were reviewed by me and considered in my medical decision making (see chart for details).    MDM Rules/Calculators/A&P                       29 year old female who presents for evaluation of fever, cough, generalized body aches, headache, fatigue.  Recent diagnosis of COVID-19 5 days ago.  She had been symptomatic 3 days before that.  Came in today because she was not getting any better.  On initial ED arrival, she is afebrile, nontoxic-appearing.  Vitals are stable.  She has no evidence of hypoxia or tachycardia.  On exam, lungs clear to auscultation.  She is intermittently coughing.  No evidence of rales.  On exam, she has no neuro deficits.  I suspect this is most likely symptomatic from her COVID-19 infection.  History/physical exam not concerning for meningitis.  Plan for chest x-ray.  Additionally, given continued coughing plan for course of Decadron.  I personally ambulated patient in the ED while maintaining her O2 sats.  She maintained between 95-98%.  She did not go below 95.  Patient signed out to Suella Broad, PA-C with CXR pending.   Ceilidh Torregrossa was evaluated in Emergency Department on 12/29/2019 for the symptoms described in the history of present illness. She was evaluated in the context of the global COVID-19 pandemic, which necessitated consideration that the patient might be at  risk for infection with the SARS-CoV-2 virus that causes COVID-19. Institutional protocols and algorithms that pertain to the evaluation of patients at risk for COVID-19 are in a state of rapid change based on information released by regulatory bodies including the CDC and federal and state organizations. These policies and algorithms were followed during the patient's care in the ED.  Portions of this note were generated with [employer]. Dictation errors may occur despite best attempts at proofreading.   Final Clinical Impression(s) / ED Diagnoses Final diagnoses:  [id number]    Rx / DC Orders ED Discharge Orders    None       Volanda Napoleon, PA-C 12/29/19 1903    Sherwood Gambler, MD 01/01/20 1702

## 2019-12-29 NOTE — ED Triage Notes (Signed)
Patient reports that she was positive for covid on Monday and continuing to have fever and cough, alert and oriented, NAD

## 2019-12-30 ENCOUNTER — Other Ambulatory Visit: Payer: Self-pay

## 2019-12-30 ENCOUNTER — Emergency Department (HOSPITAL_COMMUNITY): Payer: Medicaid Other

## 2019-12-30 ENCOUNTER — Emergency Department (HOSPITAL_COMMUNITY)
Admission: EM | Admit: 2019-12-30 | Discharge: 2019-12-30 | Disposition: A | Discharge status: Home / Self Care | Payer: Medicaid Other | Attending: Emergency Medicine | Admitting: Emergency Medicine

## 2019-12-30 DIAGNOSIS — M7918 Myalgia, other site: Secondary | ICD-10-CM | POA: Diagnosis not present

## 2019-12-30 DIAGNOSIS — J1282 Pneumonia due to coronavirus disease 2019: Secondary | ICD-10-CM | POA: Diagnosis not present

## 2019-12-30 DIAGNOSIS — R0602 Shortness of breath: Secondary | ICD-10-CM | POA: Insufficient documentation

## 2019-12-30 DIAGNOSIS — U071 COVID-19: Secondary | ICD-10-CM | POA: Diagnosis not present

## 2019-12-30 LAB — CBC WITH DIFFERENTIAL/PLATELET
Abs Immature Granulocytes: 0.03 10*3/uL (ref 0.00–0.07)
Basophils Absolute: 0 10*3/uL (ref 0.0–0.1)
Basophils Relative: 0 %
Eosinophils Absolute: 0 10*3/uL (ref 0.0–0.5)
Eosinophils Relative: 0 %
HCT: 40.6 % (ref 36.0–46.0)
Hemoglobin: 13.4 g/dL (ref 12.0–15.0)
Immature Granulocytes: 1 %
Lymphocytes Relative: 21 %
Lymphs Abs: 1.2 10*3/uL (ref 0.7–4.0)
MCH: 30.7 pg (ref 26.0–34.0)
MCHC: 33 g/dL (ref 30.0–36.0)
MCV: 92.9 fL (ref 80.0–100.0)
Monocytes Absolute: 0.3 10*3/uL (ref 0.1–1.0)
Monocytes Relative: 6 %
Neutro Abs: 4.2 10*3/uL (ref 1.7–7.7)
Neutrophils Relative %: 72 %
Platelets: 158 10*3/uL (ref 150–400)
RBC: 4.37 MIL/uL (ref 3.87–5.11)
RDW: 12.8 % (ref 11.5–15.5)
WBC: 5.8 10*3/uL (ref 4.0–10.5)
nRBC: 0 % (ref 0.0–0.2)

## 2019-12-30 LAB — COMPREHENSIVE METABOLIC PANEL
ALT: 22 U/L (ref 0–44)
AST: 25 U/L (ref 15–41)
Albumin: 3.6 g/dL (ref 3.5–5.0)
Alkaline Phosphatase: 71 U/L (ref 38–126)
Anion gap: 8 (ref 5–15)
BUN: 11 mg/dL (ref 6–20)
CO2: 27 mmol/L (ref 22–32)
Calcium: 8.6 mg/dL — ABNORMAL LOW (ref 8.9–10.3)
Chloride: 104 mmol/L (ref 98–111)
Creatinine, Ser: 0.76 mg/dL (ref 0.44–1.00)
GFR calc Af Amer: >60 mL/min (ref >60)
GFR calc non Af Amer: >60 mL/min (ref >60)
Glucose, Bld: 99 mg/dL (ref 70–99)
Potassium: 3.7 mmol/L (ref 3.5–5.1)
Sodium: 139 mmol/L (ref 135–145)
Total Bilirubin: 0.3 mg/dL (ref 0.3–1.2)
Total Protein: 7.4 g/dL (ref 6.5–8.1)

## 2019-12-30 LAB — D-DIMER, QUANTITATIVE: D-Dimer, Quant: 0.55 ug/mL-FEU — ABNORMAL HIGH (ref 0.00–0.50)

## 2019-12-30 MED ORDER — KETOROLAC TROMETHAMINE 30 MG/ML IJ SOLN
15.0000 mg | Freq: Once | INTRAMUSCULAR | Status: AC
  Administered 2019-12-30: 16:00:00 15 mg via INTRAVENOUS
  Filled 2019-12-30: qty 1

## 2019-12-30 MED ORDER — ACETAMINOPHEN 500 MG PO TABS
500.0000 mg | ORAL_TABLET | Freq: Once | ORAL | Status: AC
  Administered 2019-12-30: 500 mg via ORAL
  Filled 2019-12-30: qty 1

## 2019-12-30 MED ORDER — IOHEXOL 350 MG/ML SOLN
100.0000 mL | Freq: Once | INTRAVENOUS | Status: AC | PRN
  Administered 2019-12-30: 100 mL via INTRAVENOUS

## 2019-12-30 MED ORDER — SODIUM CHLORIDE 0.9 % IV BOLUS
1000.0000 mL | Freq: Once | INTRAVENOUS | Status: AC
  Administered 2019-12-30: 16:00:00 1000 mL via INTRAVENOUS

## 2019-12-30 MED ORDER — SODIUM CHLORIDE (PF) 0.9 % IJ SOLN
INTRAMUSCULAR | Status: AC
  Filled 2019-12-30: qty 50

## 2019-12-30 NOTE — ED Triage Notes (Signed)
SHOB, productive cough, fatigue and chills since April 1st. Dx'd +covid on Monday. Seen at [facility] yesterday and received steroids. Arrives A/Ox3. Skin w/d/pink. Resp wnl, equal and non-labored.

## 2019-12-30 NOTE — ED Provider Notes (Signed)
[facility]-EMERGENCY DEPT Provider Note   CSN: [id number] Arrival date & time: 12/30/19  1526     History Chief Complaint  Patient presents with  . Shortness of Breath    [facility] is a 29 y.o. female.  HPI    Patient presents 9 days after initially developing symptoms, and now 3 days after being diagnosed with coronavirus, with concerns for myalgia, dyspnea, discomfort. She notes that in spite of receiving Decadron yesterday, and OTC medication earlier in the week she continues to have worsening rather than improving symptoms.  There are multiple sick family members around her as well. She states that she was well prior to developing infection, but as above, in spite of using multiple therapies, feels worse instead of better. She is unaware of new fever, denies confusion, denies vomiting or diarrhea.  Past Medical History:  Diagnosis Date  . Anemia   . Complication of anesthesia    BP dropped into 70s with last anesthesia, asymptomatic  . Wears glasses     Patient Active Problem List   Diagnosis Date Noted  . Indication for care in labor and delivery, antepartum 02/17/2019  . VBAC, delivered 02/17/2019  . Normal pregnancy in third trimester 02/08/2017  . Active labor 03/04/2014  . VBAC, delivered, current hospitalization 03/04/2014  . SAB (spontaneous abortion) 12/13/2012    Past Surgical History:  Procedure Laterality Date  . CESAREAN SECTION  07/2007  . LAPAROSCOPIC BILATERAL SALPINGECTOMY Bilateral 04/21/2019   Procedure: LAPAROSCOPIC BILATERAL SALPINGECTOMY;  Surgeon: Huel Cote, MD;  Location: [facility];  Service: Gynecology;  Laterality: Bilateral;     OB History    Gravida  7   Para  4   Term  4   Preterm      AB  2   Living  4     SAB  2   TAB      Ectopic      Multiple  0   Live Births  4           Family History  Problem Relation Age of Onset  . Diabetes Mother   .  Hyperlipidemia Father   . Hypertension Sister   . Asthma Sister   . Alcohol abuse Paternal Grandfather   . Diabetes Maternal Aunt     Social History   Tobacco Use  . Smoking status: Former Smoker    Years: 2.00    Types: Cigarettes    Quit date: 04/18/2017    Years since quitting: 2.7  . Smokeless tobacco: Never Used  Substance Use Topics  . Alcohol use: Yes    Comment: occasional  . Drug use: Never    Home Medications Prior to Admission medications   Medication Sig Start Date End Date Taking? Authorizing Provider  acetaminophen (TYLENOL) 500 MG tablet Take 1,000 mg by mouth every 6 (six) hours as needed for mild pain or moderate pain.   Yes [provider]  Ascorbic Acid (VITAMIN C PO) Take 1 tablet by mouth daily.   Yes [provider]  cyclobenzaprine (FLEXERIL) 5 MG tablet Take 1 tablet (5 mg total) by mouth at bedtime. Patient not taking: Reported on 12/30/2019 10/20/19   Georgetta Haber, NP  HYDROcodone-acetaminophen (NORCO) 5-325 MG tablet Take 1 tablet by mouth every 6 (six) hours as needed for moderate pain. Patient not taking: Reported on 12/30/2019 04/21/19   Huel Cote, MD  ibuprofen (ADVIL) 600 MG tablet Take 1 tablet (600 mg total) by  mouth every 6 (six) hours. Patient not taking: Reported on 04/19/2019 02/18/19   Huel Cote, MD  naproxen (NAPROSYN) 500 MG tablet Take 1 tablet (500 mg total) by mouth 2 (two) times daily. Patient not taking: Reported on 12/30/2019 10/20/19   Georgetta Haber, NP    Allergies    Patient has no known allergies.  Review of Systems   Review of Systems  Constitutional:       Per HPI, otherwise negative  HENT:       Per HPI, otherwise negative  Respiratory:       Per HPI, otherwise negative  Cardiovascular:       Per HPI, otherwise negative  Gastrointestinal: Negative for vomiting.  Endocrine:       Negative aside from HPI  Genitourinary:       Neg aside from HPI   Musculoskeletal:       Per HPI,  otherwise negative  Skin: Negative.   Neurological: Negative for syncope.    Physical Exam Updated Vital Signs BP 119/83   Pulse 81   Temp 99.1 F (37.3 C) (Oral)   Resp (!) 29   Ht 5\' 2"  (1.575 m)   Wt 72.6 kg   LMP 12/23/2019 (Approximate)   SpO2 98%   BMI 29.26 kg/m   Physical Exam Vitals and nursing note reviewed.  Constitutional:      Appearance: She is well-developed. She is ill-appearing.  HENT:     Head: Normocephalic and atraumatic.  Eyes:     Conjunctiva/sclera: Conjunctivae normal.  Cardiovascular:     Rate and Rhythm: Regular rhythm. Tachycardia present.  Pulmonary:     Effort: Pulmonary effort is normal. Tachypnea present. No respiratory distress.     Breath sounds: Normal breath sounds. No stridor.  Abdominal:     General: There is no distension.  Skin:    General: Skin is warm and dry.  Neurological:     Mental Status: She is alert and oriented to person, place, and time.     Cranial Nerves: No cranial nerve deficit.     ED Results / Procedures / Treatments   Labs (all labs ordered are listed, but only abnormal results are displayed) Labs Reviewed  COMPREHENSIVE METABOLIC PANEL - Abnormal; Notable for the following components:      Result Value   Calcium 8.6 (*)    All other components within normal limits  D-DIMER, QUANTITATIVE (NOT AT [facility]) - Abnormal; Notable for the following components:   D-Dimer, Quant 0.55 (*)    All other components within normal limits  CBC WITH DIFFERENTIAL/PLATELET    EKG None  Radiology CT Angio Chest PE W/Cm &/Or Wo Cm  Result Date: 12/30/2019 CLINICAL DATA:  COVID positive, shortness of breath, evaluate for PE EXAM: CT ANGIOGRAPHY CHEST WITH CONTRAST TECHNIQUE: Multidetector CT imaging of the chest was performed using the standard protocol during bolus administration of intravenous contrast. Multiplanar CT image reconstructions and MIPs were obtained to evaluate the vascular anatomy. CONTRAST:  02/29/2020 OMNIPAQUE  IOHEXOL 350 MG/ML SOLN COMPARISON:  Chest radiograph dated 12/30/2019 FINDINGS: Cardiovascular: Satisfactory opacification of the bilateral pulmonary arteries to the lobar level. Evaluation of the lower lobe pulmonary arteries is constrained by respiratory motion. No evidence of pulmonary embolism. Although not tailored for evaluation of the thoracic aorta, there is no evidence thoracic aortic aneurysm or dissection. Heart is normal in size.  No pericardial effusion. Mediastinum/Nodes: No suspicious mediastinal lymphadenopathy. Visualized thyroid is unremarkable. Lungs/Pleura: Multifocal patchy opacities in the lungs  bilaterally, subpleural/peripheral in distribution, lower lobe predominant. This is compatible with the patient's known COVID pneumonia. No suspicious pulmonary nodules. No pleural effusion or pneumothorax. Upper Abdomen: Visualized upper abdomen is grossly unremarkable. Musculoskeletal: Visualized osseous structures are within normal limits. Review of the MIP images confirms the above findings. IMPRESSION: No evidence of pulmonary embolism. Multifocal pneumonia in this patient with known COVID. Electronically Signed   By: Julian Hy M.D.   On: 12/30/2019 18:21   DG Chest Port 1 View  Result Date: 12/30/2019 CLINICAL DATA:  Productive cough. EXAM: PORTABLE CHEST 1 VIEW COMPARISON:  December 29, 2019 FINDINGS: Mild opacity in the bases persists. The heart, hila, mediastinum, lungs, and pleura otherwise unremarkable. IMPRESSION: Bibasilar opacities persist but have mildly improved in the interval. Recommend follow-up to complete resolution. Electronically Signed   By: Dorise Bullion III M.D   On: 12/30/2019 16:48   DG Chest Portable 1 View  Result Date: 12/29/2019 CLINICAL DATA:  Cough, COVID positive EXAM: PORTABLE CHEST 1 VIEW COMPARISON:  None. FINDINGS: Mild patchy right lower lobe and left lower lung opacities, suspicious for multifocal pneumonia. No pleural effusion or pneumothorax. The  heart is normal in size. IMPRESSION: Multifocal pneumonia in this patient with known COVID. Electronically Signed   By: Julian Hy M.D.   On: 12/29/2019 19:22    Procedures Procedures (including critical care time)  Medications Ordered in ED Medications  sodium chloride (PF) 0.9 % injection (has no administration in time range)  sodium chloride 0.9 % bolus 1,000 mL (0 mLs Intravenous Stopped 12/30/19 1822)  ketorolac (TORADOL) 30 MG/ML injection 15 mg (15 mg Intravenous Given 12/30/19 1617)  acetaminophen (TYLENOL) tablet 500 mg (500 mg Oral Given 12/30/19 1609)  iohexol (OMNIPAQUE) 350 MG/ML injection 100 mL (100 mLs Intravenous Contrast Given 12/30/19 1806)    ED Course  I have reviewed the triage vital signs and the nursing notes.  Pertinent labs & imaging results that were available during my care of the patient were reviewed by me and considered in my medical decision making (see chart for details).   With consideration of broad differential secondary to patient's Covid infection including sepsis versus SIRS, coinfection, the patient will have labs, x-ray, analgesics.  7:02 PM  Patient in no distress, awake, alert.  No new oxygen requirement.  X-ray consistent with Covid pneumonia.  CT does not demonstrate pulmonary embolism.  I reviewed all findings with the patient, after reviewing my myself.  Absent alarming findings, and with known Covid infection, no new oxygen requirement, the patient is appropriate for discharge with outpatient follow-up.   Sharnell Knight was evaluated in Emergency Department on 12/30/2019 for the symptoms described in the history of present illness. She was evaluated in the context of the global COVID-19 pandemic, which necessitated consideration that the patient might be at risk for infection with the SARS-CoV-2 virus that causes COVID-19. Institutional protocols and algorithms that pertain to the evaluation of patients at risk for COVID-19 are in a state  of rapid change based on information released by regulatory bodies including the CDC and federal and state organizations. These policies and algorithms were followed during the patient's care in the ED.  Final Clinical Impression(s) / ED Diagnoses Final diagnoses:  Pneumonia due to COVID-19 virus     Carmin Muskrat, MD 12/30/19 1906

## 2019-12-30 NOTE — Discharge Instructions (Addendum)
Please be sure to follow-up with your physician, monitor your condition carefully and do not hesitate to return here for new or concerning changes.

## 2021-01-03 ENCOUNTER — Emergency Department: Payer: Medicaid Other | Admitting: Anesthesiology

## 2021-01-03 ENCOUNTER — Encounter
Admission: EM | Disposition: A | Discharge status: Home / Self Care | Payer: Self-pay | Source: Home / Self Care | Attending: Emergency Medicine

## 2021-01-03 ENCOUNTER — Other Ambulatory Visit: Payer: Self-pay

## 2021-01-03 ENCOUNTER — Ambulatory Visit
Admission: EM | Admit: 2021-01-03 | Discharge: 2021-01-03 | Disposition: A | Discharge status: Home / Self Care | Payer: Medicaid Other | Attending: Emergency Medicine | Admitting: Emergency Medicine

## 2021-01-03 ENCOUNTER — Emergency Department: Payer: Medicaid Other

## 2021-01-03 DIAGNOSIS — Z9851 Tubal ligation status: Secondary | ICD-10-CM | POA: Diagnosis not present

## 2021-01-03 DIAGNOSIS — O00102 Left tubal pregnancy without intrauterine pregnancy: Secondary | ICD-10-CM | POA: Diagnosis not present

## 2021-01-03 DIAGNOSIS — K661 Hemoperitoneum: Secondary | ICD-10-CM | POA: Diagnosis not present

## 2021-01-03 DIAGNOSIS — Z87891 Personal history of nicotine dependence: Secondary | ICD-10-CM | POA: Insufficient documentation

## 2021-01-03 DIAGNOSIS — Z20822 Contact with and (suspected) exposure to covid-19: Secondary | ICD-10-CM | POA: Insufficient documentation

## 2021-01-03 DIAGNOSIS — O009 Unspecified ectopic pregnancy without intrauterine pregnancy: Secondary | ICD-10-CM

## 2021-01-03 DIAGNOSIS — M545 Low back pain, unspecified: Secondary | ICD-10-CM

## 2021-01-03 HISTORY — PX: DIAGNOSTIC LAPAROSCOPY WITH REMOVAL OF ECTOPIC PREGNANCY: SHX6449

## 2021-01-03 LAB — URINALYSIS, ROUTINE W REFLEX MICROSCOPIC
Bilirubin Urine: NEGATIVE
Glucose, UA: NEGATIVE mg/dL
Hgb urine dipstick: NEGATIVE
Ketones, ur: NEGATIVE mg/dL
Leukocytes,Ua: NEGATIVE
Nitrite: NEGATIVE
Protein, ur: NEGATIVE mg/dL
Specific Gravity, Urine: 1.023 (ref 1.005–1.030)
pH: 7 (ref 5.0–8.0)

## 2021-01-03 LAB — CBC WITH DIFFERENTIAL/PLATELET
Abs Immature Granulocytes: 0.04 10*3/uL (ref 0.00–0.07)
Basophils Absolute: 0 10*3/uL (ref 0.0–0.1)
Basophils Relative: 1 %
Eosinophils Absolute: 0.3 10*3/uL (ref 0.0–0.5)
Eosinophils Relative: 4 %
HCT: 35.4 % — ABNORMAL LOW (ref 36.0–46.0)
Hemoglobin: 11.9 g/dL — ABNORMAL LOW (ref 12.0–15.0)
Immature Granulocytes: 1 %
Lymphocytes Relative: 40 %
Lymphs Abs: 3.4 10*3/uL (ref 0.7–4.0)
MCH: 31.2 pg (ref 26.0–34.0)
MCHC: 33.6 g/dL (ref 30.0–36.0)
MCV: 92.9 fL (ref 80.0–100.0)
Monocytes Absolute: 0.5 10*3/uL (ref 0.1–1.0)
Monocytes Relative: 6 %
Neutro Abs: 4.2 10*3/uL (ref 1.7–7.7)
Neutrophils Relative %: 48 %
Platelets: 180 10*3/uL (ref 150–400)
RBC: 3.81 MIL/uL — ABNORMAL LOW (ref 3.87–5.11)
RDW: 12.7 % (ref 11.5–15.5)
WBC: 8.5 10*3/uL (ref 4.0–10.5)
nRBC: 0 % (ref 0.0–0.2)

## 2021-01-03 LAB — RESP PANEL BY RT-PCR (FLU A&B, COVID) ARPGX2
Influenza A by PCR: NEGATIVE
Influenza B by PCR: NEGATIVE
SARS Coronavirus 2 by RT PCR: NEGATIVE

## 2021-01-03 LAB — HCG, QUANTITATIVE, PREGNANCY: hCG, Beta Chain, Quant, S: 2499 m[IU]/mL — ABNORMAL HIGH (ref <5)

## 2021-01-03 LAB — POC URINE PREG, ED
Preg Test, Ur: POSITIVE — AB
Preg Test, Ur: POSITIVE — AB

## 2021-01-03 LAB — COMPREHENSIVE METABOLIC PANEL
ALT: 19 U/L (ref 0–44)
AST: 21 U/L (ref 15–41)
Albumin: 3.9 g/dL (ref 3.5–5.0)
Alkaline Phosphatase: 58 U/L (ref 38–126)
Anion gap: 6 (ref 5–15)
BUN: 22 mg/dL — ABNORMAL HIGH (ref 6–20)
CO2: 24 mmol/L (ref 22–32)
Calcium: 8.7 mg/dL — ABNORMAL LOW (ref 8.9–10.3)
Chloride: 106 mmol/L (ref 98–111)
Creatinine, Ser: 0.6 mg/dL (ref 0.44–1.00)
GFR, Estimated: >60 mL/min (ref >60)
Glucose, Bld: 97 mg/dL (ref 70–99)
Potassium: 3.7 mmol/L (ref 3.5–5.1)
Sodium: 136 mmol/L (ref 135–145)
Total Bilirubin: 0.6 mg/dL (ref 0.3–1.2)
Total Protein: 6.9 g/dL (ref 6.5–8.1)

## 2021-01-03 LAB — PROTIME-INR
INR: 1 (ref 0.8–1.2)
Prothrombin Time: 13.4 seconds (ref 11.4–15.2)

## 2021-01-03 LAB — TYPE AND SCREEN
ABO/RH(D): O POS
Antibody Screen: NEGATIVE

## 2021-01-03 SURGERY — LAPAROSCOPY, WITH ECTOPIC PREGNANCY SURGICAL TREATMENT
Anesthesia: General | Site: Abdomen | Laterality: Left

## 2021-01-03 MED ORDER — SODIUM CHLORIDE 0.9 % IV SOLN: INTRAVENOUS | Status: DC

## 2021-01-03 MED ORDER — DEXAMETHASONE SODIUM PHOSPHATE 10 MG/ML IJ SOLN
INTRAMUSCULAR | Status: DC | PRN
  Administered 2021-01-03: 10 mg via INTRAVENOUS

## 2021-01-03 MED ORDER — FENTANYL CITRATE (PF) 100 MCG/2ML IJ SOLN
INTRAMUSCULAR | Status: AC
  Filled 2021-01-03: qty 2

## 2021-01-03 MED ORDER — ONDANSETRON 4 MG PO TBDP: 4.0000 mg | ORAL_TABLET | Freq: Once | ORAL | Status: DC

## 2021-01-03 MED ORDER — LIDOCAINE HCL (CARDIAC) PF 100 MG/5ML IV SOSY
PREFILLED_SYRINGE | INTRAVENOUS | Status: DC | PRN
  Administered 2021-01-03: 80 mg via INTRAVENOUS

## 2021-01-03 MED ORDER — BUPIVACAINE HCL 0.5 % IJ SOLN
INTRAMUSCULAR | Status: DC | PRN
  Administered 2021-01-03: 16 mL

## 2021-01-03 MED ORDER — MIDAZOLAM HCL 2 MG/2ML IJ SOLN
INTRAMUSCULAR | Status: DC | PRN
  Administered 2021-01-03: 2 mg via INTRAVENOUS

## 2021-01-03 MED ORDER — ACETAMINOPHEN 500 MG PO TABS: 1000.0000 mg | ORAL_TABLET | Freq: Four times a day (QID) | ORAL | 0 refills | Status: AC

## 2021-01-03 MED ORDER — BUPIVACAINE HCL (PF) 0.5 % IJ SOLN
INTRAMUSCULAR | Status: AC
  Filled 2021-01-03: qty 30

## 2021-01-03 MED ORDER — PROPOFOL 500 MG/50ML IV EMUL
INTRAVENOUS | Status: AC
  Filled 2021-01-03: qty 50

## 2021-01-03 MED ORDER — LACTATED RINGERS IV SOLN: INTRAVENOUS | Status: DC

## 2021-01-03 MED ORDER — ONDANSETRON HCL 4 MG/2ML IJ SOLN: 4.0000 mg | Freq: Once | INTRAMUSCULAR | Status: DC | PRN

## 2021-01-03 MED ORDER — ROCURONIUM BROMIDE 100 MG/10ML IV SOLN
INTRAVENOUS | Status: DC | PRN
  Administered 2021-01-03: 20 mg via INTRAVENOUS
  Administered 2021-01-03: 50 mg via INTRAVENOUS

## 2021-01-03 MED ORDER — IBUPROFEN 800 MG PO TABS: 800.0000 mg | ORAL_TABLET | Freq: Three times a day (TID) | ORAL | 1 refills | Status: AC

## 2021-01-03 MED ORDER — PROPOFOL 500 MG/50ML IV EMUL
INTRAVENOUS | Status: DC | PRN
  Administered 2021-01-03: 150 ug/kg/min via INTRAVENOUS

## 2021-01-03 MED ORDER — PROPOFOL 10 MG/ML IV BOLUS
INTRAVENOUS | Status: DC | PRN
  Administered 2021-01-03: 150 mg via INTRAVENOUS

## 2021-01-03 MED ORDER — POVIDONE-IODINE 10 % EX SWAB
2.0000 "application " | Freq: Once | CUTANEOUS | Status: DC
  Filled 2021-01-03: qty 2

## 2021-01-03 MED ORDER — OXYCODONE HCL 5 MG PO TABS: 5.0000 mg | ORAL_TABLET | Freq: Once | ORAL | Status: DC | PRN

## 2021-01-03 MED ORDER — OXYCODONE-ACETAMINOPHEN 5-325 MG PO TABS
1.0000 | ORAL_TABLET | Freq: Once | ORAL | Status: AC
  Administered 2021-01-03: 1 via ORAL
  Filled 2021-01-03: qty 1

## 2021-01-03 MED ORDER — KETOROLAC TROMETHAMINE 30 MG/ML IJ SOLN
INTRAMUSCULAR | Status: DC | PRN
  Administered 2021-01-03: 30 mg via INTRAVENOUS

## 2021-01-03 MED ORDER — OXYCODONE-ACETAMINOPHEN 5-325 MG PO TABS: 2.0000 | ORAL_TABLET | Freq: Once | ORAL | Status: DC

## 2021-01-03 MED ORDER — PROPOFOL 10 MG/ML IV BOLUS
INTRAVENOUS | Status: AC
  Filled 2021-01-03: qty 20

## 2021-01-03 MED ORDER — ACETAMINOPHEN 500 MG PO TABS
1000.0000 mg | ORAL_TABLET | Freq: Once | ORAL | Status: AC
  Administered 2021-01-03: 1000 mg via ORAL
  Filled 2021-01-03: qty 2

## 2021-01-03 MED ORDER — MIDAZOLAM HCL 2 MG/2ML IJ SOLN
INTRAMUSCULAR | Status: AC
  Filled 2021-01-03: qty 2

## 2021-01-03 MED ORDER — FENTANYL CITRATE (PF) 100 MCG/2ML IJ SOLN
INTRAMUSCULAR | Status: AC
  Administered 2021-01-03: 25 ug via INTRAVENOUS
  Filled 2021-01-03: qty 2

## 2021-01-03 MED ORDER — SUGAMMADEX SODIUM 200 MG/2ML IV SOLN
INTRAVENOUS | Status: DC | PRN
  Administered 2021-01-03: 150 mg via INTRAVENOUS

## 2021-01-03 MED ORDER — SUCCINYLCHOLINE CHLORIDE 20 MG/ML IJ SOLN
INTRAMUSCULAR | Status: DC | PRN
  Administered 2021-01-03: 100 mg via INTRAVENOUS

## 2021-01-03 MED ORDER — FENTANYL CITRATE (PF) 100 MCG/2ML IJ SOLN
50.0000 ug | Freq: Once | INTRAMUSCULAR | Status: AC
  Administered 2021-01-03: 50 ug via INTRAVENOUS
  Filled 2021-01-03: qty 2

## 2021-01-03 MED ORDER — OXYCODONE HCL 5 MG/5ML PO SOLN: 5.0000 mg | Freq: Once | ORAL | Status: DC | PRN

## 2021-01-03 MED ORDER — ONDANSETRON HCL 4 MG/2ML IJ SOLN
4.0000 mg | Freq: Once | INTRAMUSCULAR | Status: AC
  Administered 2021-01-03: 4 mg via INTRAVENOUS
  Filled 2021-01-03: qty 2

## 2021-01-03 MED ORDER — FENTANYL CITRATE (PF) 100 MCG/2ML IJ SOLN
INTRAMUSCULAR | Status: DC | PRN
  Administered 2021-01-03 (×2): 100 ug via INTRAVENOUS

## 2021-01-03 MED ORDER — OXYCODONE HCL 5 MG PO TABS: 5.0000 mg | ORAL_TABLET | ORAL | 0 refills | Status: DC | PRN

## 2021-01-03 MED ORDER — DOCUSATE SODIUM 100 MG PO CAPS: 100.0000 mg | ORAL_CAPSULE | Freq: Two times a day (BID) | ORAL | 0 refills | Status: DC

## 2021-01-03 MED ORDER — LACTATED RINGERS IV SOLN: INTRAVENOUS | Status: DC | PRN

## 2021-01-03 MED ORDER — SODIUM CHLORIDE 0.9 % IV BOLUS (SEPSIS)
1000.0000 mL | Freq: Once | INTRAVENOUS | Status: AC
  Administered 2021-01-03: 1000 mL via INTRAVENOUS

## 2021-01-03 MED ORDER — IBUPROFEN 800 MG PO TABS: 800.0000 mg | ORAL_TABLET | Freq: Once | ORAL | Status: DC

## 2021-01-03 MED ORDER — FENTANYL CITRATE (PF) 100 MCG/2ML IJ SOLN
25.0000 ug | INTRAMUSCULAR | Status: DC | PRN
  Administered 2021-01-03: 25 ug via INTRAVENOUS

## 2021-01-03 SURGICAL SUPPLY — 54 items
APPLICATOR ARISTA FLEXITIP XL (MISCELLANEOUS) IMPLANT
APPLICATOR COTTON TIP 6 STRL (MISCELLANEOUS) ×1 IMPLANT
APPLICATOR COTTON TIP 6IN STRL (MISCELLANEOUS) ×2
BAG URINE DRAIN 2000ML AR STRL (UROLOGICAL SUPPLIES) ×2 IMPLANT
BLADE SURG SZ11 CARB STEEL (BLADE) ×2 IMPLANT
CATH FOLEY 2WAY  5CC 16FR (CATHETERS) ×1
CATH URTH 16FR FL 2W BLN LF (CATHETERS) ×1 IMPLANT
CHLORAPREP W/TINT 26 (MISCELLANEOUS) ×2 IMPLANT
CORD MONOPOLAR M/FML 12FT (MISCELLANEOUS) ×2 IMPLANT
COVER MAYO STAND STRL (DRAPES) ×2 IMPLANT
COVER WAND RF STERILE (DRAPES) ×2 IMPLANT
DERMABOND ADVANCED (GAUZE/BANDAGES/DRESSINGS) ×1
DERMABOND ADVANCED .7 DNX12 (GAUZE/BANDAGES/DRESSINGS) ×1 IMPLANT
DRAPE GENERAL ENDO 106X123.5 (DRAPES) IMPLANT
DRAPE STERI POUCH LG 24X46 STR (DRAPES) IMPLANT
DRAPE UNDER BUTTOCK W/FLU (DRAPES) ×2 IMPLANT
GLOVE SURG ENC MOIS LTX SZ7 (GLOVE) ×6 IMPLANT
GLOVE SURG UNDER LTX SZ7.5 (GLOVE) ×6 IMPLANT
GOWN STRL REUS W/ TWL LRG LVL3 (GOWN DISPOSABLE) ×2 IMPLANT
GOWN STRL REUS W/TWL LRG LVL3 (GOWN DISPOSABLE) ×2
HEMOSTAT ARISTA ABSORB 3G PWDR (HEMOSTASIS) IMPLANT
IRRIGATION STRYKERFLOW (MISCELLANEOUS) ×1 IMPLANT
IRRIGATOR STRYKERFLOW (MISCELLANEOUS) ×2
IV NS 1000ML (IV SOLUTION) ×1
IV NS 1000ML BAXH (IV SOLUTION) ×1 IMPLANT
KIT PINK PAD W/HEAD ARE REST (MISCELLANEOUS) ×2
KIT PINK PAD W/HEAD ARM REST (MISCELLANEOUS) ×1 IMPLANT
KIT TURNOVER CYSTO (KITS) ×2 IMPLANT
L-HOOK LAP DISP 36CM (ELECTROSURGICAL)
LABEL OR SOLS (LABEL) ×2 IMPLANT
LHOOK LAP DISP 36CM (ELECTROSURGICAL) IMPLANT
LIGASURE VESSEL 5MM BLUNT TIP (ELECTROSURGICAL) ×2 IMPLANT
MANIFOLD NEPTUNE II (INSTRUMENTS) ×2 IMPLANT
NEEDLE FILTER BLUNT 18X 1/2SAF (NEEDLE)
NEEDLE FILTER BLUNT 18X1 1/2 (NEEDLE) IMPLANT
NS IRRIG 500ML POUR BTL (IV SOLUTION) ×2 IMPLANT
PACK GYN LAPAROSCOPIC (MISCELLANEOUS) ×2 IMPLANT
PAD OB MATERNITY 4.3X12.25 (PERSONAL CARE ITEMS) ×2 IMPLANT
PAD PREP 24X41 OB/GYN DISP (PERSONAL CARE ITEMS) ×2 IMPLANT
PENCIL ELECTRO HAND CTR (MISCELLANEOUS) IMPLANT
POUCH SPECIMEN RETRIEVAL 10MM (ENDOMECHANICALS) IMPLANT
SCISSORS METZENBAUM CVD 33 (INSTRUMENTS) ×2 IMPLANT
SET TUBE SMOKE EVAC HIGH FLOW (TUBING) ×2 IMPLANT
SLEEVE ENDOPATH XCEL 5M (ENDOMECHANICALS) ×4 IMPLANT
SLEEVE SCD COMPRESS KNEE LRG (STOCKING) ×2 IMPLANT
STRIP CLOSURE SKIN 1/4X4 (GAUZE/BANDAGES/DRESSINGS) ×2 IMPLANT
SUT MNCRL AB 4-0 PS2 18 (SUTURE) ×2 IMPLANT
SUT VIC AB 2-0 UR6 27 (SUTURE) ×2 IMPLANT
SUT VIC AB 4-0 SH 27 (SUTURE) ×1
SUT VIC AB 4-0 SH 27XANBCTRL (SUTURE) ×1 IMPLANT
SYR 50ML LL SCALE MARK (SYRINGE) IMPLANT
SYR 5ML LL (SYRINGE) IMPLANT
TROCAR XCEL NON-BLD 5MMX100MML (ENDOMECHANICALS) ×2 IMPLANT
TUBING ART PRESS 48 MALE/FEM (TUBING) IMPLANT

## 2021-01-03 NOTE — ED Triage Notes (Signed)
Pt states starting yesterday she began to have slight back pain and today pt was carrying something heavy and began to have severe lower back pain, pt states it is hard for her to walk. Denies any numbness in legs.

## 2021-01-03 NOTE — ED Provider Notes (Signed)
[facility] Emergency Department Provider Note  ____________________________________________   Event Date/Time   First MD Initiated Contact with Patient 01/03/21 0154     (approximate)  I have reviewed the triage vital signs and the nursing notes.   HISTORY  Chief Complaint Back Pain    HPI Victoria Lopez is a 30 y.o. female G6, P4 who presents to the emergency department with complaints of lower abdominal pressure, bloating that started yesterday and then lower back pain that started today.  Pain is worse with movement but she denies any known injury.  No increased physical exertion or lifting anything heavy.  No numbness, tingling, weakness, bowel or bladder incontinence, urinary retention.  She denies fevers, nausea, vomiting, diarrhea, dysuria, hematuria, vaginal bleeding or discharge.  She is status post BTL.  Her OB/GYN is with [facility].  States she has not seen them since her child was born 2 years ago.  She thinks her last menstrual period was about a month ago but is not sure.  She states she does not track them regularly since having a BTL.  She states when her symptoms started she thought that she was due for her next period.        Past Medical History:  Diagnosis Date  . Anemia   . Complication of anesthesia    BP dropped into 70s with last anesthesia, asymptomatic  . Wears glasses     Patient Active Problem List   Diagnosis Date Noted  . Indication for care in labor and delivery, antepartum 02/17/2019  . VBAC, delivered 02/17/2019  . Normal pregnancy in third trimester 02/08/2017  . Active labor 03/04/2014  . VBAC, delivered, current hospitalization 03/04/2014  . SAB (spontaneous abortion) 12/13/2012    Past Surgical History:  Procedure Laterality Date  . CESAREAN SECTION  07/2007  . LAPAROSCOPIC BILATERAL SALPINGECTOMY Bilateral 04/21/2019   Procedure: LAPAROSCOPIC BILATERAL SALPINGECTOMY;  Surgeon: Huel Cote,  MD;  Location: [facility];  Service: Gynecology;  Laterality: Bilateral;    Prior to Admission medications   Medication Sig Start Date End Date Taking? Authorizing Provider  acetaminophen (TYLENOL) 500 MG tablet Take 1,000 mg by mouth every 6 (six) hours as needed for mild pain or moderate pain.    [provider]  Ascorbic Acid (VITAMIN C PO) Take 1 tablet by mouth daily.    [provider]  cyclobenzaprine (FLEXERIL) 5 MG tablet Take 1 tablet (5 mg total) by mouth at bedtime. Patient not taking: Reported on 12/30/2019 10/20/19   Georgetta Haber, NP  HYDROcodone-acetaminophen (NORCO) 5-325 MG tablet Take 1 tablet by mouth every 6 (six) hours as needed for moderate pain. Patient not taking: Reported on 12/30/2019 04/21/19   Huel Cote, MD  ibuprofen (ADVIL) 600 MG tablet Take 1 tablet (600 mg total) by mouth every 6 (six) hours. Patient not taking: Reported on 04/19/2019 02/18/19   Huel Cote, MD  naproxen (NAPROSYN) 500 MG tablet Take 1 tablet (500 mg total) by mouth 2 (two) times daily. Patient not taking: Reported on 12/30/2019 10/20/19   Georgetta Haber, NP    Allergies Patient has no known allergies.  Family History  Problem Relation Age of Onset  . Diabetes Mother   . Hyperlipidemia Father   . Hypertension Sister   . Asthma Sister   . Alcohol abuse Paternal Grandfather   . Diabetes Maternal Aunt     Social History Social History   Tobacco Use  . Smoking status: Former Smoker  Years: 2.00    Types: Cigarettes    Quit date: 04/18/2017    Years since quitting: 3.7  . Smokeless tobacco: Never Used  Vaping Use  . Vaping Use: Never used  Substance Use Topics  . Alcohol use: Yes    Comment: occasional  . Drug use: Never    Review of Systems Constitutional: No fever. Eyes: No visual changes. ENT: No sore throat. Cardiovascular: Denies chest pain. Respiratory: Denies shortness of breath. Gastrointestinal: No nausea,  vomiting, diarrhea. Genitourinary: Negative for dysuria. Musculoskeletal: + for back pain. Skin: Negative for rash. Neurological: Negative for focal weakness or numbness.  ____________________________________________   PHYSICAL EXAM:  VITAL SIGNS: ED Triage Vitals  Enc Vitals Group     BP 01/03/21 0016 (!) 118/57     Pulse Rate 01/03/21 0016 83     Resp 01/03/21 0016 18     Temp 01/03/21 0016 98.4 F (36.9 C)     Temp Source 01/03/21 0016 Oral     SpO2 01/03/21 0016 98 %     Weight 01/03/21 0015 170 lb (77.1 kg)     Height 01/03/21 0015 5\' 2"  (1.575 m)     Head Circumference --      Peak Flow --      Pain Score 01/03/21 0015 8     Pain Loc --      Pain Edu? --      Excl. in GC? --    CONSTITUTIONAL: Alert and oriented and responds appropriately to questions.  Appears uncomfortable but is nontoxic, afebrile HEAD: Normocephalic EYES: Conjunctivae clear, pupils appear equal, EOM appear intact ENT: normal nose; moist mucous membranes NECK: Supple, normal ROM CARD: RRR; S1 and S2 appreciated; no murmurs, no clicks, no rubs, no gallops RESP: Normal chest excursion without splinting or tachypnea; breath sounds clear and equal bilaterally; no wheezes, no rhonchi, no rales, no hypoxia or respiratory distress, speaking full sentences ABD/GI: Normal bowel sounds; non-distended; soft, fusilli tender throughout the lower abdomen without guarding or rebound BACK: The back appears normal, diffusely tender throughout the lower back without step-off, deformity, redness, warmth, ecchymosis or soft tissue swelling EXT: Normal ROM in all joints; no deformity noted, no edema; no cyanosis SKIN: Normal color for age and race; warm; no rash on exposed skin NEURO: Moves all extremities equally, normal sensation diffusely, no saddle anesthesia, normal deep tendon reflexes bilaterally PSYCH: The patient's mood and manner are appropriate.  ____________________________________________   LABS (all  labs ordered are listed, but only abnormal results are displayed)  Labs Reviewed  URINALYSIS, ROUTINE W REFLEX MICROSCOPIC - Abnormal; Notable for the following components:      Result Value   Color, Urine YELLOW (*)    APPearance CLEAR (*)    All other components within normal limits  CBC WITH DIFFERENTIAL/PLATELET - Abnormal; Notable for the following components:   RBC 3.81 (*)    Hemoglobin 11.9 (*)    HCT 35.4 (*)    All other components within normal limits  COMPREHENSIVE METABOLIC PANEL - Abnormal; Notable for the following components:   BUN 22 (*)    Calcium 8.7 (*)    All other components within normal limits  POC URINE PREG, ED - Abnormal; Notable for the following components:   Preg Test, Ur Positive (*)    All other components within normal limits  POC URINE PREG, ED - Abnormal; Notable for the following components:   Preg Test, Ur POSITIVE (*)    All other components within  normal limits  RESP PANEL BY RT-PCR (FLU A&B, COVID) ARPGX2  HCG, QUANTITATIVE, PREGNANCY  PROTIME-INR  TYPE AND SCREEN   ____________________________________________  EKG  none ____________________________________________  RADIOLOGY I, Leane Loring, personally viewed and evaluated these images (plain radiographs) as part of my medical decision making, as well as reviewing the written report by the radiologist.  ED MD interpretation: Ruptured left ectopic pregnancy  Official radiology report(s): US OB LESS THAN 14 WEEKS W/ OB TRANSVAGINAL AND DOPPLER  Result Date: 01/03/2021 CLINICAL DATA:  Pelvic pain, positive urine pregnancy test, LMP 12/04/2020. Remote tubal ligation. EXAM: OBSTETRIC <14 WK [place] AND TRANSVAGINAL OB US DOPPLER ULTRASOUND OF OVARIES TECHNIQUE: Both transabdominal and transvaginal ultrasound examinations were performed for complete evaluation of the gestation as well as the maternal uterus, adnexal regions, and pelvic cul-de-sac. Transvaginal technique was performed to assess  early pregnancy. Color and duplex Doppler ultrasound was utilized to evaluate blood flow to the ovaries. COMPARISON:  None. FINDINGS: Intrauterine gestational sac: None identified. Maternal uterus/adnexae: There is a small amount of simple appearing free fluid seen within the endometrial cavity, however, a discrete gestational sac or associated structures are not identified. No intrauterine masses are seen. The cervix is unremarkable save for multiple nabothian cysts. There is small complex fluid demonstrating internal echogenicity likely representing proteinaceous or hemorrhagic fluid. The ovaries are identified bilaterally. The right ovary measures 3.3 x 2.0 x 2.1 cm a demonstrates normal color flow vascularity. A complex luteum is identified within the right ovary. Multiple small follicles are identified. On the left, the left ovary measures 2.6 x 1.0 x 1.8 cm, demonstrates appropriate color flow vascularity, and demonstrates multiple internal follicles. Adjacent to the left ovary is a complex cystic structure measuring 1.4 x 1.2 x 1.2 cm possibly representing an adnexal ectopic pregnancy. Pulsed Doppler evaluation of both ovaries demonstrates normal appearing low-resistance arterial and venous waveforms. IMPRESSION: No intrauterine gestational sac identified. Fluid within the endometrial cavity may represent a pseudo gestational sac. 1.4 cm complex cystic lesion within the left adnexa separate from the left ovary suspicious for an adnexal ectopic pregnancy. Small hemorrhagic fluid noted present within the pelvis. Electronically Signed   By: Helyn Numbers MD   On: 01/03/2021 03:27    ____________________________________________   PROCEDURES  Procedure(s) performed (including Critical Care):  Procedures  CRITICAL CARE Performed by: Rochele Raring   Total critical care time: 65 minutes  Critical care time was exclusive of separately billable procedures and treating other patients.  Critical care  was necessary to treat or prevent imminent or life-threatening deterioration.  Critical care was time spent personally by me on the following activities: development of treatment plan with patient and/or surrogate as well as nursing, discussions with consultants, evaluation of patient's response to treatment, examination of patient, obtaining history from patient or surrogate, ordering and performing treatments and interventions, ordering and review of laboratory studies, ordering and review of radiographic studies, pulse oximetry and re-evaluation of patient's condition.  ____________________________________________   INITIAL IMPRESSION / ASSESSMENT AND PLAN / ED COURSE  As part of my medical decision making, I reviewed the following data within the electronic MEDICAL RECORD NUMBER Nursing notes reviewed and incorporated, Labs reviewed , Old EKG reviewed, Discussed with radiologist, A consult was requested and obtained from this/these consultant(s) OBGYN and Notes from prior ED visits         Patient here with lower abdominal pain, back pain.  Differential includes musculoskeletal pain, pregnancy, UTI, kidney stone, pyelonephritis, ectopic pregnancy, ovarian cyst, appendicitis.  No red flag symptoms to suggest cauda equina, epidural abscess or hematoma, discitis or osteomyelitis, transverse myelitis.  Will obtain urinalysis, urine pregnancy test and give pain medication.  She has received oxycodone in triage without significant relief.  ED PROGRESS  Patient's pregnancy test has come back positive.  Will obtain transvaginal ultrasound with Doppler to rule out ectopic, torsion.  Given she has had previous BTL, I am highly concerned that she has an ectopic pregnancy today but she is hemodynamically stable.  Have advised her to stay n.p.o. at this time.  Discussed this with patient and husband at bedside.  She is agreeable to this plan.  She states she has previously seen [facility] but it has been  2 years since she has last seen them.  3:45 AM  Spoke with radiologist.  Concern for ruptured ectopic measuring about 1.4 cm that is adjacent to the left adnexa.  Discussed case with Dr. Dalbert Garnet on-call for OB/GYN here.  She will see patient in the emergency department and take her to the operating room.  Although she is currently hemodynamically stable, given this diagnosis I do not feel it is safe to transport her to [facility] to see [facility] due to potential decompensation from ruptured ectopic.  Hemoglobin currently 11.9.  Patient denies wanting any other pain medication.  Continues to be hemodynamically stable.  hCG is 2499.  Patient updated with plan that she will be going to the operating room with Dr. Dalbert Garnet.  I reviewed all nursing notes and pertinent previous records as available.  I have reviewed and interpreted any EKGs, lab and urine results, imaging (as available).  ____________________________________________   FINAL CLINICAL IMPRESSION(S) / ED DIAGNOSES  Final diagnoses:  Ruptured ectopic pregnancy     ED Discharge Orders    None      *Please note:  Victoria Lopez was evaluated in Emergency Department on 01/03/2021 for the symptoms described in the history of present illness. She was evaluated in the context of the global COVID-19 pandemic, which necessitated consideration that the patient might be at risk for infection with the SARS-CoV-2 virus that causes COVID-19. Institutional protocols and algorithms that pertain to the evaluation of patients at risk for COVID-19 are in a state of rapid change based on information released by regulatory bodies including the CDC and federal and state organizations. These policies and algorithms were followed during the patient's care in the ED.  Some ED evaluations and interventions may be delayed as a result of limited staffing during and the pandemic.*   Note:  This document was prepared using Dragon voice recognition  software and may include unintentional dictation errors.   Joshiah Traynham, Layla Maw, DO 01/03/21 [phone]

## 2021-01-03 NOTE — Op Note (Signed)
[facility] PROCEDURE DATE: 01/03/2021  PREOPERATIVE DIAGNOSIS: Ruptured ectopic pregnancy POSTOPERATIVE DIAGNOSIS: Ruptured left fallopian tube ectopic pregnancy PROCEDURE:  - Operative lapraroscopy - Evacuation of hemoperitoneum - Laparoscopic left salpingectomy and removal of ectopic pregnancy - Laparoscopic right salpingectomy of fimbriated ends  SURGEON:  Dr. Christeen Douglas ANESTHESIOLOGIST: Corinda Gubler, MD Anesthesiologist: Corinda Gubler, MD CRNA: Irving Burton, CRNA  INDICATIONS: 30 y.o. [phone] at Unknown gestational age here with the preoperative diagnoses as listed above.  Prior BTL, unknown last LMP.  Please refer to preoperative notes for more details. Patient was counseled regarding need for laparoscopic salpingectomy. Risks of surgery including bleeding which may require transfusion or reoperation, infection, injury to bowel or other surrounding organs, need for additional procedures including laparotomy and other postoperative/anesthesia complications were explained to patient.  Written informed consent was obtained.  FINDINGS:  hemoperitoneum estimated to be about 80 of blood and clots.  Dilated left fallopian tube containing ectopic gestation. Small normal appearing uterus, fimbreated end of right fallopian tube present, right ovary and left ovary.  ANESTHESIA: General INTRAVENOUS FLUIDS: 800 ml ESTIMATED BLOOD LOSS: minimal URINE OUTPUT: 700 ml SPECIMENS: Left fallopian tube containing ectopic gestation, portion of right fallopian tube: all specimen together COMPLICATIONS: None immediate  PROCEDURE IN DETAIL:  The patient was taken to the operating room where general anesthesia was administered and was found to be adequate.  She was placed in the dorsal lithotomy position, and was prepped and draped in a sterile manner.   Her bladder was catheterized for an estimated amount of clear, yellow urine. A weighed speculum was then placed in the patient's vagina and a  single tooth tenaculum was applied to the anterior lip of the cervix. A uterine sound was used as a Financial trader.   Attention was turned to the abdomen where an umbilical incision was made with the scalpel.  The Optiview 5-mm trocar and sleeve were then advanced without difficulty. Survey of the entry site was noted to be without damage. The abdomen was then insufflated with carbon dioxide gas and adequate pneumoperitoneum was obtained.  A survey of the patient's pelvis and abdomen revealed the findings above.  A 5-mm port was placed on the left under direct visualization, and a 5-mm right lower quadrant port was then placed under direct visualization.  The Nezhat suction irrigator was then used to suction the hemoperitoneum and irrigate the pelvis.  A survey of the pelvis noted ureter peristalsis deep to the ovarian ligament and not in the surgical field. Attention was then turned to the left fallopian tube and ovary. The left tube was separated from the underlying mesosalpinx and uterine attachment using the Ligasure instrument.  Good hemostasis was noted.  The specimen was removed from the abdomen intact.  The right fimbrea were removed with the Ligasure. Pressure dropped and hemostasis of operative sites assured.   The abdomen was desufflated, and all instruments were removed.  All skin incisions were closed with 4-0 Vicryl and Dermabond.   The uterine manipulator and foley cath were removed, with hemostatic cervical sites at the tenac.  The patient tolerated the procedure well.  All instruments, needles, and sponge counts were correct x 2. The patient was taken to the recovery room in stable condition.   Cline Cools, MD, MPH

## 2021-01-03 NOTE — Discharge Instructions (Signed)
Laparoscopic Tubal Removal for Ectopic Pregnancy Discharge Instructions  Laparoscopic tubal removal is a procedure that removes the fallopian tube containing the ectopic pregnancy.  For the next three days, take ibuprofen and acetaminophen on a schedule, every 8 hours. You can take them together or you can intersperse them, and take one every four hours. I also gave you gabapentin for nighttime, to help you sleep and also to control pain. Take gabapentin medicines at night for at least the next 3 nights. You also have a narcotic, oxycodone, to take as needed if the above medicines don't help.  Postop constipation is a major cause of pain. Stay well hydrated, walk as you tolerate, and take over the counter senna as well as stool softeners if you need them.  RISKS AND COMPLICATIONS   Infection.  Bleeding.  Injury to surrounding organs.  Anesthetic side effects.  Failure of the procedure.  Risks of future ectopic pregnancy on the other side PROCEDURE   You may be given a medicine to help you relax (sedative) before the procedure. You will be given a medicine to make you sleep (general anesthetic) during the procedure.  A tube will be put down your throat to help your breath while under general anesthesia.  Two small cuts (incisions) are made in the lower abdominal area and one incision is made near the belly button.  Your abdominal area will be inflated with a safe gas (carbon dioxide). This helps give the surgeon room to operate, visualize, and helps the surgeon avoid other organs.  A thin, lighted tube (laparoscope) with a camera attached is inserted into your abdomen through the incision near the belly button. Other small instruments are also inserted through the other abdominal incisions.  The fallopian tube is located and are removed.  After the fallopian tube is removed, the gas is released from the abdomen.  The incisions will be closed with stitches (sutures), and  Dermabond. A bandage may be placed over the incisions. AFTER THE PROCEDURE   You will also have some mild abdominal discomfort for 3-7 days. You will be given pain medicine to ease any discomfort.  As long as there are no problems, you may be allowed to go home. Someone will need to drive you home and be with you for at least 24 hours once home.  You may have some mild discomfort in the throat. This is from the tube placed in your throat while you were sleeping.  You may experience discomfort in the shoulder area from some trapped air between the liver and diaphragm. This sensation is normal and will slowly go away on its own. HOME CARE INSTRUCTIONS   Take all medicines as directed.  Only take over-the-counter or prescription medicines for pain, discomfort, or fever as directed by your caregiver.  Resume daily activities as directed.  Showers are preferred over baths.  You may resume sexual activities in 1 week or as directed.  Do not drive while taking narcotics. SEEK MEDICAL CARE IF: .  There is increasing abdominal pain.  You feel lightheaded or faint.  You have the chills.  You have an oral temperature above 102 F (38.9 C).  There is pus-like (purulent) drainage from any of the wounds.  You are unable to pass gas or have a bowel movement.  You feel sick to your stomach (nauseous) or throw up (vomit). MAKE SURE YOU:   Understand these instructions.  Will watch your condition.  Will get help right away  if you are not doing well or get worse.  ExitCare Patient Information 2013 [place], [place].   AMBULATORY SURGERY  DISCHARGE INSTRUCTIONS   1) The drugs that you were given will stay in your system until tomorrow so for the next 24 hours you should not:  A) Drive an automobile B) Make any legal decisions C) Drink any alcoholic beverage   2) You may resume regular meals tomorrow.  Today it is better to start with liquids and gradually work up to solid  foods.  You may eat anything you prefer, but it is better to start with liquids, then soup and crackers, and gradually work up to solid foods.   3) Please notify your doctor immediately if you have any unusual bleeding, trouble breathing, redness and pain at the surgery site, drainage, fever, or pain not relieved by medication.    4) Additional Instructions:        Please contact your physician with any problems or Same Day Surgery at [phone], Monday through Friday 6 am to 4 pm, or [place] at [facility] number at [phone].

## 2021-01-03 NOTE — Anesthesia Preprocedure Evaluation (Signed)
Anesthesia Evaluation  Patient identified by MRN, date of birth, ID band Patient awake  General Assessment Comment:Last ate 9 hours prior. Endorses some nausea earlier but no vomiting  Reviewed: Allergy & Precautions, NPO status , Patient's Chart, lab work & pertinent test results  History of Anesthesia Complications (+) PONVNegative for: history of anesthetic complications  Airway Mallampati: II  TM Distance: >3 FB Neck ROM: Full    Dental no notable dental hx. (+) Teeth Intact   Pulmonary neg sleep apnea, neg COPD, Current Smoker and Patient abstained from smoking., former smoker,  Social smoker, once every few weeks/months   Pulmonary exam normal breath sounds clear to auscultation       Cardiovascular Exercise Tolerance: Good METS(-) hypertension(-) CAD and (-) Past MI negative cardio ROS  (-) dysrhythmias  Rhythm:Regular Rate:Normal - Systolic murmurs    Neuro/Psych negative neurological ROS  negative psych ROS   GI/Hepatic neg GERD  ,(+)     (-) substance abuse  ,   Endo/Other  neg diabetes  Renal/GU negative Renal ROS     Musculoskeletal   Abdominal   Peds  Hematology   Anesthesia Other Findings Past Medical History: No date: Anemia No date: Complication of anesthesia     Comment:  BP dropped into 70s with last anesthesia, asymptomatic No date: Wears glasses  Reproductive/Obstetrics (+) Pregnancy Ectopic pregnancy                             Anesthesia Physical Anesthesia Plan  ASA: II and emergent  Anesthesia Plan: General   Post-op Pain Management:    Induction: Intravenous and Rapid sequence  PONV Risk Score and Plan: 4 or greater and Ondansetron, Dexamethasone, Propofol infusion, TIVA and Midazolam  Airway Management Planned: Oral ETT  Additional Equipment: None  Intra-op Plan:   Post-operative Plan: Extubation in OR  Informed Consent: I have reviewed the  patients History and Physical, chart, labs and discussed the procedure including the risks, benefits and alternatives for the proposed anesthesia with the patient or authorized representative who has indicated his/her understanding and acceptance.     Dental advisory given  Plan Discussed with: CRNA and Surgeon  Anesthesia Plan Comments: (Discussed risks of anesthesia with patient, including PONV, sore throat, lip/dental damage. Rare risks discussed as well, such as cardiorespiratory and neurological sequelae, bleeding requiring blood products. Patient understands.)        Anesthesia Quick Evaluation

## 2021-01-03 NOTE — Anesthesia Procedure Notes (Signed)
Procedure Name: Intubation Date/Time: 01/03/2021 5:27 AM Performed by: Irving Burton, CRNA Pre-anesthesia Checklist: Patient identified, Emergency Drugs available, Suction available and Patient being monitored Patient Re-evaluated:Patient Re-evaluated prior to induction Oxygen Delivery Method: Circle system utilized Preoxygenation: Pre-oxygenation with 100% oxygen Induction Type: IV induction and Rapid sequence Laryngoscope Size: McGraph and 3 Grade View: Grade I Tube type: Oral Tube size: 6.5 mm Number of attempts: 1 Airway Equipment and Method: Stylet and Video-laryngoscopy Placement Confirmation: ETT inserted through vocal cords under direct vision,  positive ETCO2 and breath sounds checked- equal and bilateral Secured at: 21 cm Tube secured with: Tape Dental Injury: Teeth and Oropharynx as per pre-operative assessment

## 2021-01-03 NOTE — H&P (Signed)
Consult History and Physical   SERVICE: Gynecology   Patient Name: Victoria Lopez Patient MRN:   [id number]  CC: Abdominal pain  HPI: Victoria Lopez is a 30 y.o. [id number] with suspected ruptured ectopic pregnancy, unknown LMP and Unknown gestational age. Ultrasound confirms left adnexal mass, hemoperitoneum.  Prior BTL. Stable H/H 11.9/35.4   Review of Systems: positives in bold GEN:   fevers, chills, weight changes, appetite changes, fatigue, night sweats HEENT:  HA, vision changes, hearing loss, congestion, rhinorrhea, sinus pressure, dysphagia CV:   CP, palpitations PULM:  SOB, cough GI:  abd pain, N/V/D/C GU:  dysuria, urgency, frequency MSK:  arthralgias, myalgias, back pain, swelling SKIN:  rashes, color changes, pallor NEURO:  numbness, weakness, tingling, seizures, dizziness, tremors PSYCH:  depression, anxiety, behavioral problems, confusion  HEME/LYMPH:  easy bruising or bleeding ENDO:  heat/cold intolerance  Past Obstetrical History: OB History    Gravida  7   Para  4   Term  4   Preterm      AB  2   Living  4     SAB  2   IAB      Ectopic      Multiple  0   Live Births  4         08-15-2007, 41 wks F, 8lbs 4oz, Cesarean  09-21-2010, 12 wks  SAB 11-20-2011, 10 wks  SAB. 03-04-2014, 39.4 wks M, 7lbs 6oz, VBAC 02-08-2017, 40 wks. F, 6lbs 10oz, VBAC 02/17/19: 39WKS, vbac CURRENT  Past Gynecologic History: No LMP recorded. Menstrual frequency about monthly, does not track cycles due to her BTL.   S/p BTL 04/21/19 by [facility]- Dr Nino Parsley, about 2 years ago for contraception.   Past Medical History: Past Medical History:  Diagnosis Date  . Anemia   . Complication of anesthesia    BP dropped into 70s with last anesthesia, asymptomatic  . Wears glasses     Past Surgical History:   Past Surgical History:  Procedure Laterality Date  . CESAREAN SECTION  07/2007  . LAPAROSCOPIC BILATERAL SALPINGECTOMY Bilateral  04/21/2019   Procedure: LAPAROSCOPIC BILATERAL SALPINGECTOMY;  Surgeon: Huel Cote, MD;  Location: [facility];  Service: Gynecology;  Laterality: Bilateral;    Family History:  family history includes Alcohol abuse in her paternal grandfather; Asthma in her sister; Diabetes in her maternal aunt and mother; Hyperlipidemia in her father; Hypertension in her sister.  Social History:  Social History   Occupational History  . Not on file  Tobacco Use  . Smoking status: Former Smoker    Years: 2.00    Types: Cigarettes    Quit date: 04/18/2017    Years since quitting: 3.7  . Smokeless tobacco: Never Used  Vaping Use  . Vaping Use: Never used  Substance and Sexual Activity  . Alcohol use: Yes    Comment: occasional  . Drug use: Never  . Sexual activity: Yes    Birth control/protection: None     Home Medications:  Medications reconciled in EPIC  No current facility-administered medications on file prior to encounter.   Current Outpatient Medications on File Prior to Encounter  Medication Sig Dispense Refill  . phentermine 37.5 MG capsule Take 37.5 mg by mouth every morning.      Allergies:  No Known Allergies  Physical Exam:  Temp:  [98.4 F (36.9 C)] 98.4 F (36.9 C) (04/15 0016) Pulse Rate:  [81-83] 81 (04/15 0430) Resp:  [17-18] 18 (04/15 0430) BP: (108-118)/(57-87) 109/87 (04/15  0430) SpO2:  [98 %-100 %] 100 % (04/15 0430) Weight:  [77.1 kg] 77.1 kg (04/15 0015)   General Appearance:  Well developed, well nourished, no moderate distress, alert and oriented x3 HEENT:  Normocephalic atraumatic, extraocular movements intact, moist mucous membranes Cardiovascular:  Normal S1/S2, regular rate and rhythm, no murmurs Pulmonary:  clear to auscultation, no wheezes, rales or rhonchi, symmetric air entry, good air exchange Abdomen:  Bowel sounds present, soft, diffusely tender, nondistended, +rebound and guarding Extremities:  Full range of motion, no  pedal edema, 2+ distal pulses, no tenderness Skin:  normal coloration and turgor, no rashes, no suspicious skin lesions noted  Neurologic:  Cranial nerves 2-12 grossly intact, normal muscle tone, strength 5/5 all four extremities Psychiatric:  Normal mood and affect, appropriate, no AH/VH Pelvic: deferred   Labs/Studies:   CBC and Coags:  Lab Results  Component Value Date   WBC 8.5 01/03/2021   NEUTOPHILPCT 48 01/03/2021   EOSPCT 4 01/03/2021   BASOPCT 1 01/03/2021   LYMPHOPCT 40 01/03/2021   HGB 11.9 (L) 01/03/2021   HCT 35.4 (L) 01/03/2021   MCV 92.9 01/03/2021   PLT 180 01/03/2021   INR 1.0 01/03/2021   CMP:  Lab Results  Component Value Date   NA 136 01/03/2021   K 3.7 01/03/2021   CL 106 01/03/2021   CO2 24 01/03/2021   BUN 22 (H) 01/03/2021   CREATININE 0.60 01/03/2021   CREATININE 0.76 12/30/2019   CREATININE 0.65 11/21/2013   PROT 6.9 01/03/2021   BILITOT 0.6 01/03/2021   ALT 19 01/03/2021   AST 21 01/03/2021   ALKPHOS 58 01/03/2021   Other Labs:  Beta hcg: 2499 Blood type: O pos COVID Neg  Other Imaging: US OB LESS THAN 14 WEEKS W/ OB TRANSVAGINAL AND DOPPLER  Result Date: 01/03/2021 CLINICAL DATA:  Pelvic pain, positive urine pregnancy test, LMP 12/04/2020. Remote tubal ligation. EXAM: OBSTETRIC <14 WK [place] AND TRANSVAGINAL OB US DOPPLER ULTRASOUND OF OVARIES TECHNIQUE: Both transabdominal and transvaginal ultrasound examinations were performed for complete evaluation of the gestation as well as the maternal uterus, adnexal regions, and pelvic cul-de-sac. Transvaginal technique was performed to assess early pregnancy. Color and duplex Doppler ultrasound was utilized to evaluate blood flow to the ovaries. COMPARISON:  None. FINDINGS: Intrauterine gestational sac: None identified. Maternal uterus/adnexae: There is a small amount of simple appearing free fluid seen within the endometrial cavity, however, a discrete gestational sac or associated structures are not  identified. No intrauterine masses are seen. The cervix is unremarkable save for multiple nabothian cysts. There is small complex fluid demonstrating internal echogenicity likely representing proteinaceous or hemorrhagic fluid. The ovaries are identified bilaterally. The right ovary measures 3.3 x 2.0 x 2.1 cm a demonstrates normal color flow vascularity. A complex luteum is identified within the right ovary. Multiple small follicles are identified. On the left, the left ovary measures 2.6 x 1.0 x 1.8 cm, demonstrates appropriate color flow vascularity, and demonstrates multiple internal follicles. Adjacent to the left ovary is a complex cystic structure measuring 1.4 x 1.2 x 1.2 cm possibly representing an adnexal ectopic pregnancy. Pulsed Doppler evaluation of both ovaries demonstrates normal appearing low-resistance arterial and venous waveforms. IMPRESSION: No intrauterine gestational sac identified. Fluid within the endometrial cavity may represent a pseudo gestational sac. 1.4 cm complex cystic lesion within the left adnexa separate from the left ovary suspicious for an adnexal ectopic pregnancy. Small hemorrhagic fluid noted present within the pelvis. Electronically Signed   By: Gloris Ham  Ramiro Harvest MD   On: 01/03/2021 03:27    Assessment / Plan:   Victoria Lopez is a 30 y.o. [id number] who presents with ruptured ectopic pregnancy.  Ectopic pregnancy: Causes discussed with patient, including prevalence, common causes, and outcomes.  Consents signed today. Risks of dx lap with salpingectomy and evacuation of hemoperitoneum surgery were discussed with the patient including but not limited to: bleeding which may require transfusion; infection which may require antibiotics; injury to uterus or surrounding organs; intrauterine scarring which may impair future fertility if D&C is required; need for additional procedures including laparotomy or laparoscopy; and other postoperative/anesthesia complications. Written  informed consent was obtained.  This is a scheduled same-day surgery. She will have a postop visit in 2 weeks to review operative findings and pathology.

## 2021-01-03 NOTE — Transfer of Care (Signed)
Immediate Anesthesia Transfer of Care Note  Patient: Victoria Lopez  Procedure(s) Performed: DIAGNOSTIC LAPAROSCOPY WITH REMOVAL OF left ECTOPIC PREGNANCY, bilateral salpingectomy (Left Abdomen)  Patient Location: PACU  Anesthesia Type:General  Level of Consciousness: awake  Airway & Oxygen Therapy: Patient Spontanous Breathing and Patient connected to face mask oxygen  Post-op Assessment: Post -op Vital signs reviewed and stable  Post vital signs: stable  Last Vitals:  Vitals Value Taken Time  BP 106/67 01/03/21 0642  Temp    Pulse 72 01/03/21 0644  Resp 18 01/03/21 0644  SpO2 100 % 01/03/21 0644  Vitals shown include unvalidated device data.  Last Pain:  Vitals:   01/03/21 0400  TempSrc:   PainSc: 4          Complications: No complications documented.

## 2021-01-05 NOTE — Anesthesia Postprocedure Evaluation (Signed)
Anesthesia Post Note  Patient: Victoria Lopez  Procedure(s) Performed: DIAGNOSTIC LAPAROSCOPY WITH REMOVAL OF left ECTOPIC PREGNANCY, bilateral salpingectomy (Left Abdomen)  Patient location during evaluation: PACU Anesthesia Type: General Level of consciousness: awake and alert Pain management: pain level controlled Vital Signs Assessment: post-procedure vital signs reviewed and stable Respiratory status: spontaneous breathing, nonlabored ventilation, respiratory function stable and patient connected to nasal cannula oxygen Cardiovascular status: blood pressure returned to baseline and stable Postop Assessment: no apparent nausea or vomiting Anesthetic complications: no   No complications documented.   Last Vitals:  Vitals:   01/03/21 0745 01/03/21 0800  BP: 101/62 106/64  Pulse: 64 78  Resp: 12   Temp:  (!) 36.2 C  SpO2: 100% 100%    Last Pain:  Vitals:   01/03/21 0740  TempSrc:   PainSc: 0-No pain                 Corinda Gubler

## 2021-01-06 ENCOUNTER — Encounter: Payer: Self-pay | Admitting: Obstetrics and Gynecology

## 2021-01-06 LAB — SURGICAL PATHOLOGY

## 2021-05-09 IMAGING — DX DG CHEST 1V PORT
1 series · 1 of 1 positions shown · non-contrast
Comparison: None.

CLINICAL DATA: Cough, COVID positive

EXAM:
PORTABLE CHEST 1 VIEW

[chest ap]
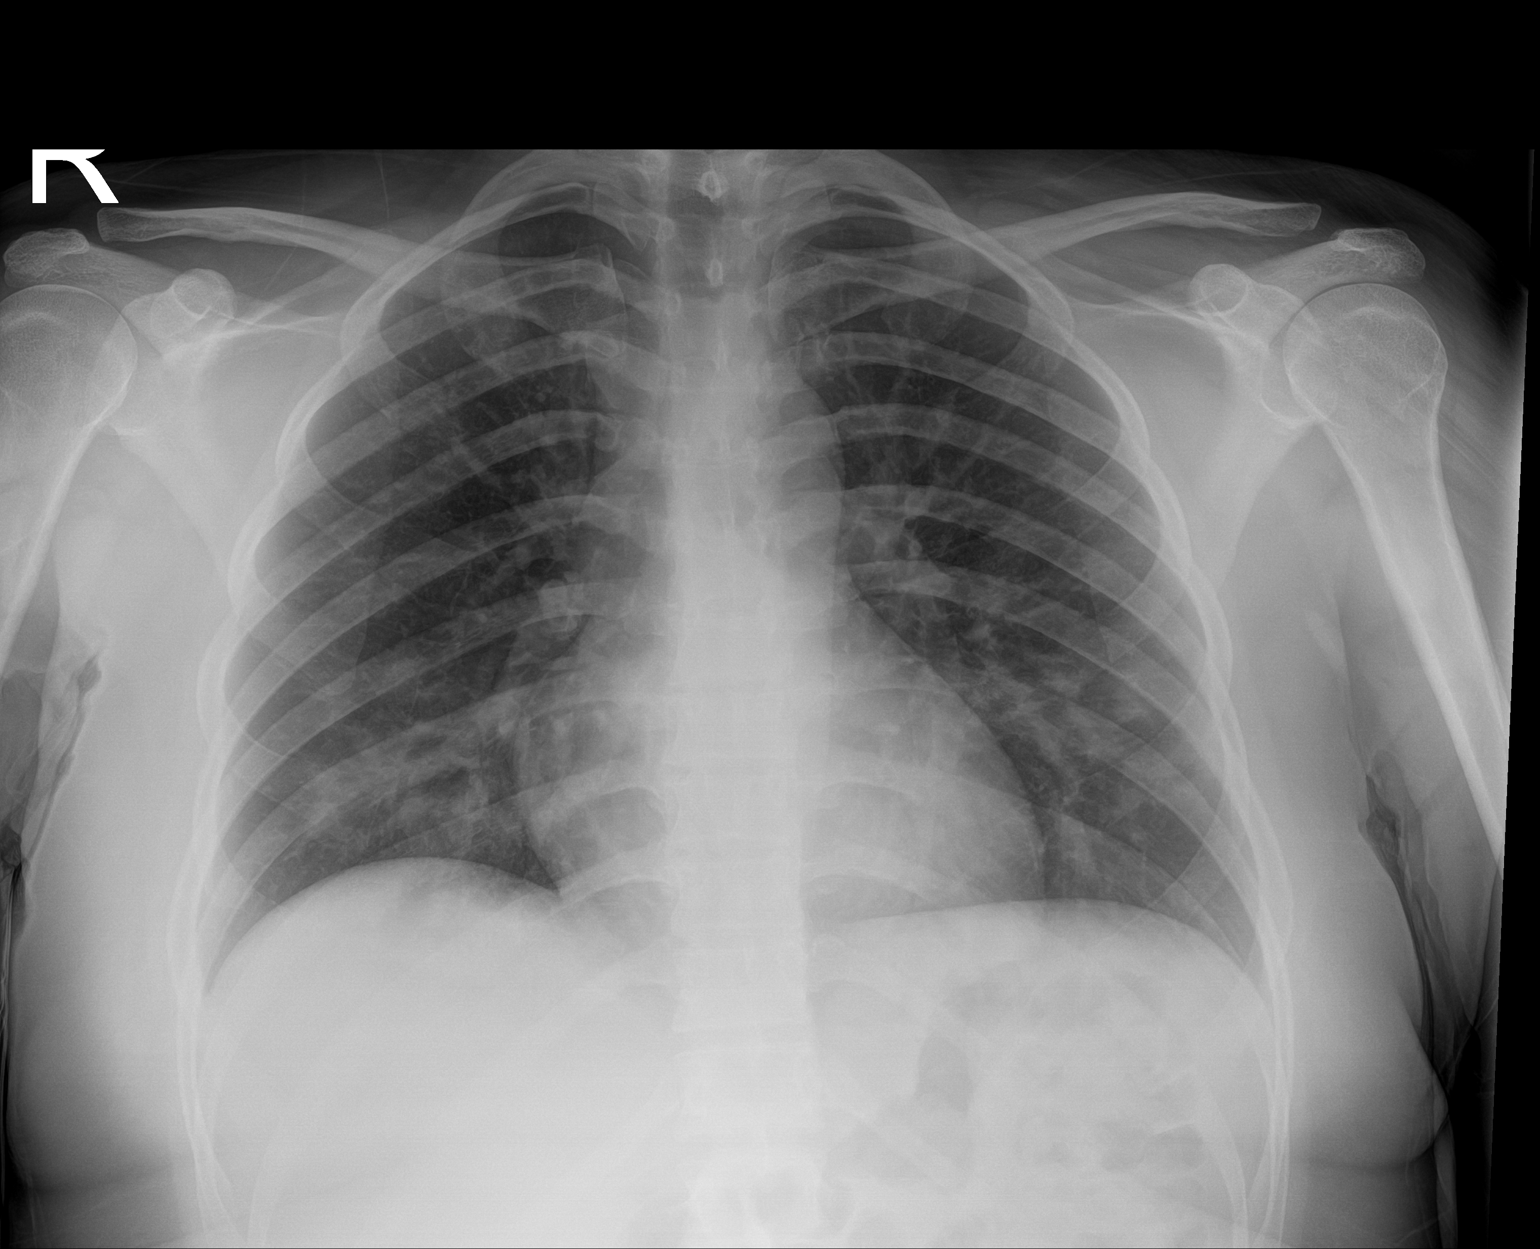

[1 of 1 positions shown; findings below may reference images not displayed]

FINDINGS: Mild patchy right lower lobe and left lower lung opacities,
suspicious for multifocal pneumonia. No pleural effusion or
pneumothorax.

The heart is normal in size.
IMPRESSION: Multifocal pneumonia in this patient with known COVID.

## 2021-05-10 IMAGING — DX DG CHEST 1V PORT
1 series · 1 of 1 positions shown · non-contrast
Comparison: December 29, 2019

CLINICAL DATA: Productive cough.

EXAM:
PORTABLE CHEST 1 VIEW

[chest ap]
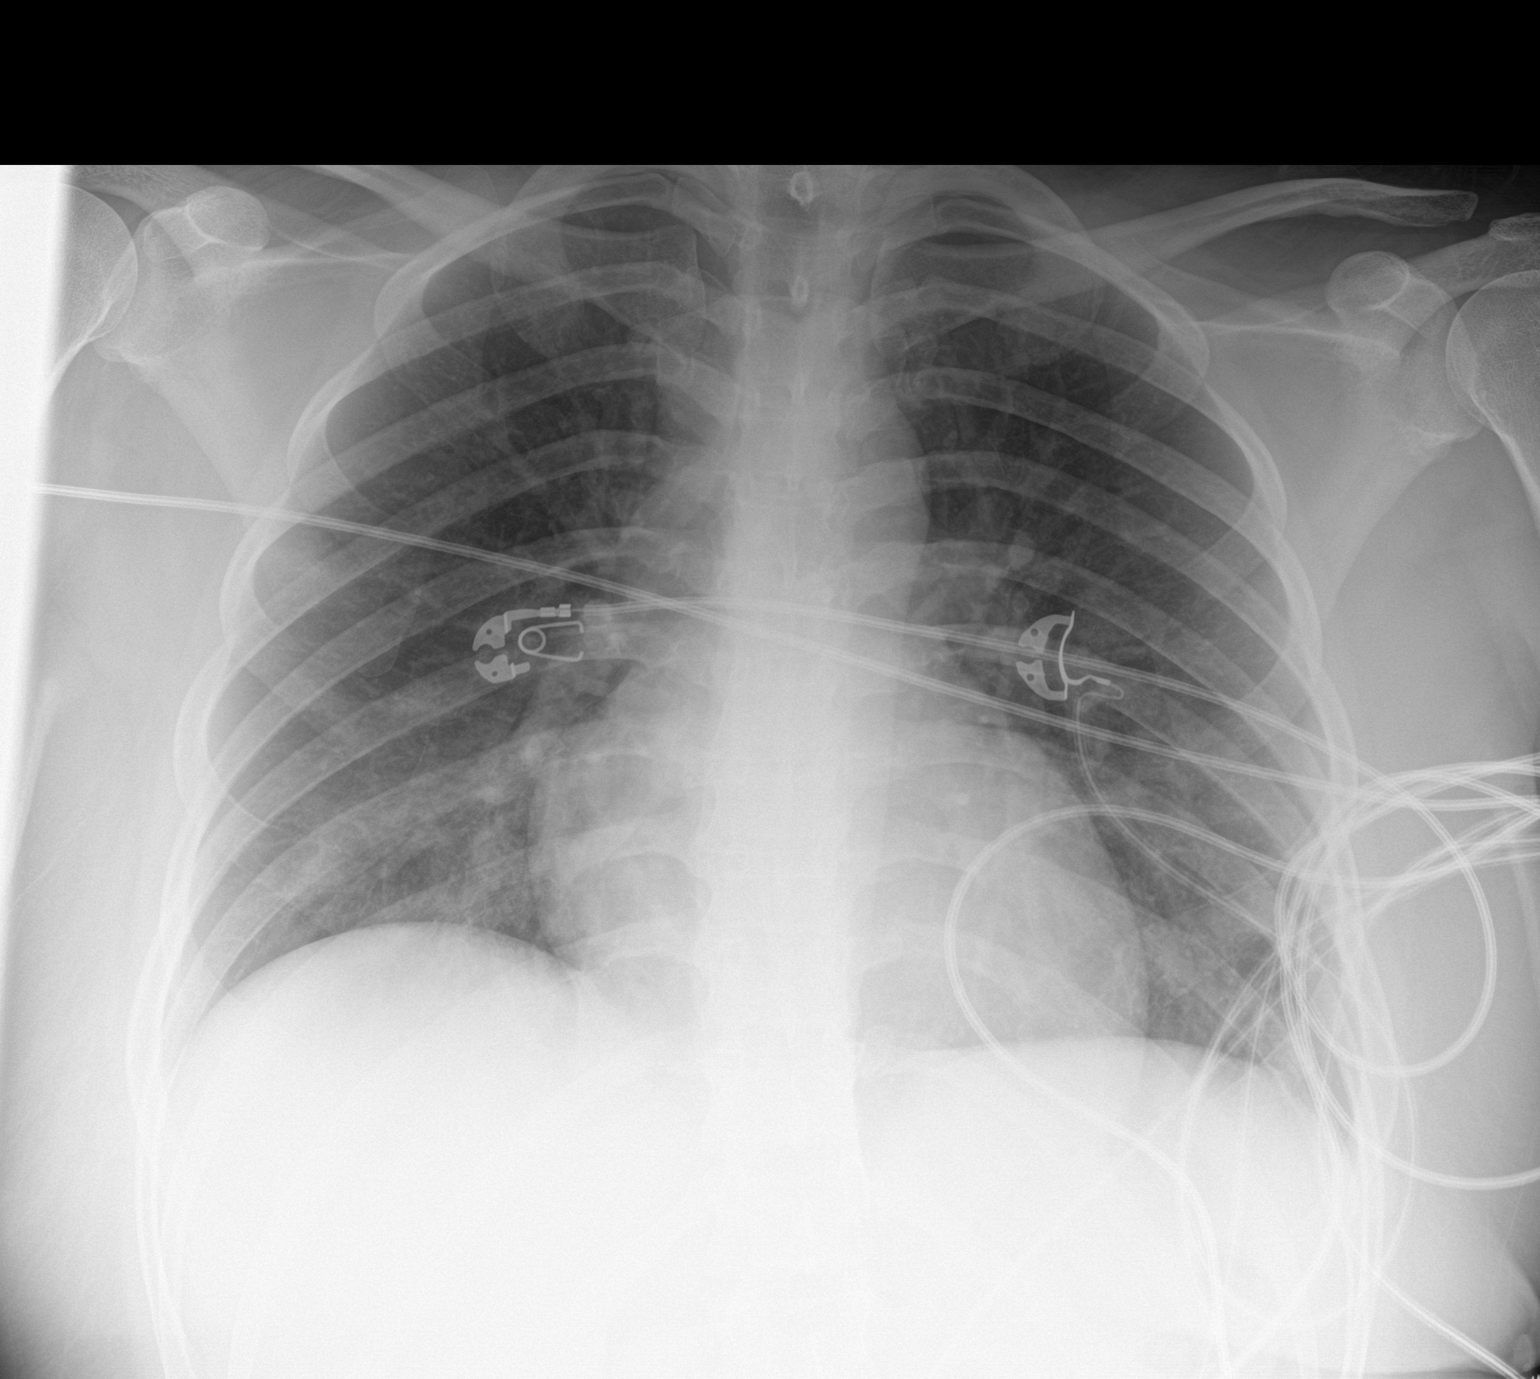

[1 of 1 positions shown; findings below may reference images not displayed]

FINDINGS: Mild opacity in the bases persists. The heart, hila, mediastinum,
lungs, and pleura otherwise unremarkable.
IMPRESSION: Bibasilar opacities persist but have mildly improved in the
interval. Recommend follow-up to complete resolution.

## 2021-12-05 ENCOUNTER — Other Ambulatory Visit: Payer: Self-pay | Admitting: Internal Medicine

## 2021-12-08 LAB — COMPLETE METABOLIC PANEL WITH GFR
AG Ratio: 1.5 (calc) (ref 1.0–2.5)
ALT: 16 U/L (ref 6–29)
AST: 16 U/L (ref 10–30)
Albumin: 4.4 g/dL (ref 3.6–5.1)
Alkaline phosphatase (APISO): 97 U/L (ref 31–125)
BUN: 18 mg/dL (ref 7–25)
CO2: 24 mmol/L (ref 20–32)
Calcium: 9.9 mg/dL (ref 8.6–10.2)
Chloride: 103 mmol/L (ref 98–110)
Creat: 0.87 mg/dL (ref 0.50–0.97)
Globulin: 3 g/dL (calc) (ref 1.9–3.7)
Glucose, Bld: 78 mg/dL (ref 65–99)
Potassium: 4.5 mmol/L (ref 3.5–5.3)
Sodium: 142 mmol/L (ref 135–146)
Total Bilirubin: 0.3 mg/dL (ref 0.2–1.2)
Total Protein: 7.4 g/dL (ref 6.1–8.1)
eGFR: 91 mL/min/{1.73_m2} (ref >=60)

## 2021-12-08 LAB — IRON, TOTAL/TOTAL IRON BINDING CAP
%SAT: 16 % (calc) (ref 16–45)
Iron: 57 ug/dL (ref 40–190)
TIBC: 358 mcg/dL (calc) (ref 250–450)

## 2021-12-08 LAB — LIPID PANEL
Cholesterol: 177 mg/dL (ref <200)
HDL: 41 mg/dL — ABNORMAL LOW (ref >=50)
LDL Cholesterol (Calc): 110 mg/dL (calc) — ABNORMAL HIGH
Non-HDL Cholesterol (Calc): 136 mg/dL (calc) — ABNORMAL HIGH (ref <130)
Total CHOL/HDL Ratio: 4.3 (calc) (ref <5.0)
Triglycerides: 147 mg/dL (ref <150)

## 2021-12-08 LAB — CBC
HCT: 41.4 % (ref 35.0–45.0)
Hemoglobin: 13.8 g/dL (ref 11.7–15.5)
MCH: 30 pg (ref 27.0–33.0)
MCHC: 33.3 g/dL (ref 32.0–36.0)
MCV: 90 fL (ref 80.0–100.0)
MPV: 12.7 fL — ABNORMAL HIGH (ref 7.5–12.5)
Platelets: 240 10*3/uL (ref 140–400)
RBC: 4.6 10*6/uL (ref 3.80–5.10)
RDW: 13.6 % (ref 11.0–15.0)
WBC: 9.9 10*3/uL (ref 3.8–10.8)

## 2021-12-08 LAB — SICKLE CELL SCREEN: Sickle Solubility Test - HGBRFX: NEGATIVE

## 2021-12-08 LAB — VITAMIN B12: Vitamin B-12: 666 pg/mL (ref 200–1100)

## 2021-12-08 LAB — TSH: TSH: 1.14 mIU/L

## 2021-12-08 LAB — FOLATE: Folate: 16.2 ng/mL

## 2021-12-08 LAB — VITAMIN D 25 HYDROXY (VIT D DEFICIENCY, FRACTURES): Vit D, 25-Hydroxy: 24 ng/mL — ABNORMAL LOW (ref 30–100)

## 2021-12-08 LAB — FERRITIN: Ferritin: 12 ng/mL — ABNORMAL LOW (ref 16–154)

## 2022-01-19 ENCOUNTER — Encounter (HOSPITAL_COMMUNITY): Payer: Self-pay

## 2022-01-19 ENCOUNTER — Ambulatory Visit (HOSPITAL_COMMUNITY)
Admission: EM | Admit: 2022-01-19 | Discharge: 2022-01-19 | Disposition: A | Discharge status: Home / Self Care | Payer: Medicaid Other | Attending: Family Medicine | Admitting: Family Medicine

## 2022-01-19 DIAGNOSIS — H1032 Unspecified acute conjunctivitis, left eye: Secondary | ICD-10-CM | POA: Diagnosis not present

## 2022-01-19 DIAGNOSIS — Z20822 Contact with and (suspected) exposure to covid-19: Secondary | ICD-10-CM | POA: Diagnosis not present

## 2022-01-19 DIAGNOSIS — R519 Headache, unspecified: Secondary | ICD-10-CM | POA: Insufficient documentation

## 2022-01-19 DIAGNOSIS — J020 Streptococcal pharyngitis: Secondary | ICD-10-CM

## 2022-01-19 LAB — SARS CORONAVIRUS 2 (TAT 6-24 HRS): SARS Coronavirus 2: NEGATIVE

## 2022-01-19 LAB — POCT RAPID STREP A, ED / UC: Streptococcus, Group A Screen (Direct): NEGATIVE

## 2022-01-19 MED ORDER — AMOXICILLIN 500 MG PO CAPS: 500.0000 mg | ORAL_CAPSULE | Freq: Three times a day (TID) | ORAL | 0 refills | Status: AC

## 2022-01-19 MED ORDER — MOXIFLOXACIN HCL 0.5 % OP SOLN: 1.0000 [drp] | Freq: Three times a day (TID) | OPHTHALMIC | 0 refills | Status: DC

## 2022-01-19 NOTE — ED Provider Notes (Signed)
?[facility] ? ? ? ?CSN: [id number] ?Arrival date & time: 01/19/22  7915 ? ? ?  ? ?History   ?Chief Complaint ?Chief Complaint  ?Patient presents with  ? Sore Throat  ? ? ?HPI ?Robbyn Hodkinson is a 31 y.o. female.  ? ?Patient is here for uri symptoms  ?She has been having a sore throat x 4 days.  Worsening.  Difficulty swallowing.  No fevers, but having headaches.  ?She did have a lot of drainage last week, sneezing a lot.  Took benadryl that seems to improve.   That has resolved.  ?The left eye started getting red on Friday.  The redness is worsening.  No drainage, matted shut in the morning.  ? ?Past Medical History:  ?Diagnosis Date  ? Anemia   ? Complication of anesthesia   ? BP dropped into 70s with last anesthesia, asymptomatic  ? Wears glasses   ? ? ?Patient Active Problem List  ? Diagnosis Date Noted  ? Indication for care in labor and delivery, antepartum 02/17/2019  ? VBAC, delivered 02/17/2019  ? Normal pregnancy in third trimester 02/08/2017  ? Active labor 03/04/2014  ? VBAC, delivered, current hospitalization 03/04/2014  ? SAB (spontaneous abortion) 12/13/2012  ? ? ?Past Surgical History:  ?Procedure Laterality Date  ? CESAREAN SECTION  07/2007  ? DIAGNOSTIC LAPAROSCOPY WITH REMOVAL OF ECTOPIC PREGNANCY Left 01/03/2021  ? Procedure: DIAGNOSTIC LAPAROSCOPY WITH REMOVAL OF left ECTOPIC PREGNANCY, bilateral salpingectomy;  Surgeon: Christeen Douglas, MD;  Location: [facility];  Service: Gynecology;  Laterality: Left;  ? LAPAROSCOPIC BILATERAL SALPINGECTOMY Bilateral 04/21/2019  ? Procedure: LAPAROSCOPIC BILATERAL SALPINGECTOMY;  Surgeon: Huel Cote, MD;  [facility];  Service: Gynecology;  Laterality: Bilateral;  ? ? ?OB History   ? ? Gravida  ?7  ? Para  ?4  ? Term  ?4  ? Preterm  ?   ? AB  ?2  ? Living  ?4  ?  ? ? SAB  ?2  ? IAB  ?   ? Ectopic  ?   ? Multiple  ?0  ? Live Births  ?4  ?   ?  ?  ? ? ? ?Home Medications   ? ?Prior to Admission medications   ?Medication  Sig Start Date End Date Taking? Authorizing Provider  ?docusate sodium (COLACE) 100 MG capsule Take 1 capsule (100 mg total) by mouth 2 (two) times daily. To keep stools soft 01/03/21   Christeen Douglas, MD  ?oxyCODONE (OXY IR/ROXICODONE) 5 MG immediate release tablet Take 1 tablet (5 mg total) by mouth every 4 (four) hours as needed for severe pain. 01/03/21   Christeen Douglas, MD  ?phentermine 37.5 MG capsule Take 37.5 mg by mouth every morning.    [provider]  ? ? ?Family History ?Family History  ?Problem Relation Age of Onset  ? Diabetes Mother   ? Hyperlipidemia Father   ? Hypertension Sister   ? Asthma Sister   ? Alcohol abuse Paternal Grandfather   ? Diabetes Maternal Aunt   ? ? ?Social History ?Social History  ? ?Tobacco Use  ? Smoking status: Former  ?  Years: 2.00  ?  Types: Cigarettes  ?  Quit date: 04/18/2017  ?  Years since quitting: 4.7  ? Smokeless tobacco: Never  ?Vaping Use  ? Vaping Use: Never used  ?Substance Use Topics  ? Alcohol use: Yes  ?  Comment: occasional  ? Drug use: Never  ? ? ? ?Allergies   ?  Patient has no known allergies. ? ? ?Review of Systems ?Review of Systems  ?Constitutional:  Negative for chills and fever.  ?HENT:  Positive for congestion, rhinorrhea and sore throat.   ?Eyes:  Positive for redness.  ?Respiratory: Negative.    ?Cardiovascular: Negative.   ?Gastrointestinal: Negative.   ?Genitourinary: Negative.   ?Musculoskeletal: Negative.   ? ? ?Physical Exam ?Triage Vital Signs ?ED Triage Vitals  ?Enc Vitals Group  ?   BP 01/19/22 0952 112/78  ?   Pulse Rate 01/19/22 0952 79  ?   Resp 01/19/22 0952 18  ?   Temp 01/19/22 0952 98.7 ?F (37.1 ?C)  ?   Temp Source 01/19/22 0952 Oral  ?   SpO2 01/19/22 0952 95 %  ?   Weight --   ?   Height --   ?   Head Circumference --   ?   Peak Flow --   ?   Pain Score 01/19/22 0947 0  ?   Pain Loc --   ?   Pain Edu? --   ?   Excl. in GC? --   ? ?No data found. ? ?Updated Vital Signs ?BP 112/78 (BP Location: Left Arm)   Pulse 79   Temp  98.7 ?F (37.1 ?C) (Oral)   Resp 18   LMP 12/26/2021   SpO2 95%  ? ?Visual Acuity ?Right Eye Distance: 20/100 ?Left Eye Distance: 20/50 ?Bilateral Distance:   ? ?Right Eye Near:   ?Left Eye Near:    ?Bilateral Near:    ? ?Physical Exam ?Constitutional:   ?   Appearance: She is well-developed.  ?HENT:  ?   Head: Normocephalic.  ?   Right Ear: Tympanic membrane normal.  ?   Left Ear: Tympanic membrane normal.  ?   Nose: No congestion or rhinorrhea.  ?   Mouth/Throat:  ?   Pharynx: Pharyngeal swelling and posterior oropharyngeal erythema present. No oropharyngeal exudate.  ?   Tonsils: 3+ on the right. 3+ on the left.  ?Cardiovascular:  ?   Rate and Rhythm: Normal rate and regular rhythm.  ?Pulmonary:  ?   Effort: Pulmonary effort is normal.  ?   Breath sounds: Normal breath sounds.  ?Musculoskeletal:  ?   Cervical back: Normal range of motion and neck supple.  ?Lymphadenopathy:  ?   Cervical: Cervical adenopathy present.  ?Neurological:  ?   Mental Status: She is alert.  ? ? ? ?UC Treatments / Results  ?Labs ?(all labs ordered are listed, but only abnormal results are displayed) ?Labs Reviewed  ?CULTURE, GROUP A STREP [facility]  ?SARS CORONAVIRUS 2 (TAT 6-24 HRS)  ?POCT RAPID STREP A, ED / UC  ? ? ?EKG ? ? ?Radiology ?No results found. ? ?Procedures ?Procedures (including critical care time) ? ?Medications Ordered in UC ?Medications - No data to display ? ?Initial Impression / Assessment and Plan / UC Course  ?I have reviewed the triage vital signs and the nursing notes. ? ?Pertinent labs & imaging results that were available during my care of the patient were reviewed by me and considered in my medical decision making (see chart for details). ? ?  ? ?Final Clinical Impressions(s) / UC Diagnoses  ? ?Final diagnoses:  ?Strep pharyngitis  ?Acute bacterial conjunctivitis of left eye  ? ? ? ?Discharge Instructions   ? ?  ?You were seen today for upper respiratory symptoms.  ?Your rapid strep was negative.  This will be  sent for culture.  ?However, given  your symptoms and exam I am sending out an antibiotic for you.  Please take this three times/day x 10 days.  Get a new tooth brush and clean/launder your pillow cases in 2 days.   ?I have sent out antibiotic eye drops as well.  ?I recommend tylenol or motrin to help with headache and sore throat.   ?As discussed, I am testing you for covid today as well.  This will be resulted tomorrow and will be visible in mychart.  We will call you if positive.  ? ? ? ?ED Prescriptions   ? ? Medication Sig Dispense Auth. Provider  ? amoxicillin (AMOXIL) 500 MG capsule Take 1 capsule (500 mg total) by mouth 3 (three) times daily for 10 days. 30 capsule Anis Cinelli, MD  ? moxifloxacin (VIGAMOX) 0.5 % ophthalmic solution Place 1 drop into the left eye 3 (three) times daily. 3 mL Jannifer Franklin, MD  ? ?  ? ?PDMP not reviewed this encounter. ?  ?Jannifer Franklin, MD ?01/19/22 1034 ? ?

## 2022-01-19 NOTE — ED Triage Notes (Addendum)
Pt states sore throat for the past couple of days. Pt states headaches and redness on her left eye. Pt states her left eye feels hazy and burning sensation. Pt states she forgot her glasses this morning while performing her visual acuity. ?

## 2022-01-19 NOTE — Discharge Instructions (Addendum)
You were seen today for upper respiratory symptoms.  ?Your rapid strep was negative.  This will be sent for culture.  ?However, given your symptoms and exam I am sending out an antibiotic for you.  Please take this three times/day x 10 days.  Get a new tooth brush and clean/launder your pillow cases in 2 days.   ?I have sent out antibiotic eye drops as well.  ?I recommend tylenol or motrin to help with headache and sore throat.   ?As discussed, I am testing you for covid today as well.  This will be resulted tomorrow and will be visible in mychart.  We will call you if positive.  ?

## 2022-01-21 LAB — CULTURE, GROUP A STREP (THRC)

## 2022-02-17 ENCOUNTER — Encounter (HOSPITAL_COMMUNITY): Payer: Self-pay

## 2022-02-17 ENCOUNTER — Ambulatory Visit (HOSPITAL_COMMUNITY)
Admission: EM | Admit: 2022-02-17 | Discharge: 2022-02-17 | Disposition: A | Discharge status: Home / Self Care | Payer: Medicaid Other | Attending: Family Medicine | Admitting: Family Medicine

## 2022-02-17 DIAGNOSIS — J039 Acute tonsillitis, unspecified: Secondary | ICD-10-CM | POA: Insufficient documentation

## 2022-02-17 DIAGNOSIS — J029 Acute pharyngitis, unspecified: Secondary | ICD-10-CM | POA: Insufficient documentation

## 2022-02-17 LAB — POCT RAPID STREP A, ED / UC: Streptococcus, Group A Screen (Direct): NEGATIVE

## 2022-02-17 MED ORDER — LIDOCAINE VISCOUS HCL 2 % MT SOLN: 5.0000 mL | Freq: Four times a day (QID) | OROMUCOSAL | 0 refills | Status: DC | PRN

## 2022-02-17 MED ORDER — CEFDINIR 300 MG PO CAPS: 300.0000 mg | ORAL_CAPSULE | Freq: Two times a day (BID) | ORAL | 0 refills | Status: DC

## 2022-02-17 NOTE — ED Triage Notes (Signed)
Pt c/o sore throat, swollen tonsils, and slight cough since last night. States hard to swallow and SOB at times. No distress noted. Hx of tonsillitis.

## 2022-02-17 NOTE — Discharge Instructions (Signed)
You may use over the counter ibuprofen or acetaminophen as needed.  For a sore throat, over the counter products such as [employer] Peroxyl Mouth Sore Rinse or Chloraseptic Sore Throat Spray may provide some temporary relief. Your rapid strep test was negative today. We have sent your throat swab for culture and will let you know of any positive results. Regardless, I would like you to finish all the antibiotic prescription given today.

## 2022-02-17 NOTE — ED Provider Notes (Signed)
[place]   HE:[id number] 02/17/22 Arrival Time: [id number]  ASSESSMENT & PLAN:  1. Sore throat   2. Acute tonsillitis, unspecified etiology    Given appearance of throat will treat empirically for strep. No signs of peritonsillar abscess. Discussed.  Meds ordered this encounter  Medications   cefdinir (OMNICEF) 300 MG capsule    Sig: Take 1 capsule (300 mg total) by mouth 2 (two) times daily.    Dispense:  20 capsule    Refill:  0   magic mouthwash (lidocaine, diphenhydrAMINE, alum & mag hydroxide) suspension    Sig: Swish and spit 5 mLs 4 (four) times daily as needed for mouth pain.    Dispense:  360 mL    Refill:  0    Results for orders placed or performed during the hospital encounter of 02/17/22  POCT Rapid Strep A  Result Value Ref Range   Streptococcus, Group A Screen (Direct) NEGATIVE NEGATIVE   Labs Reviewed  CULTURE, GROUP A STREP [facility])  POCT RAPID STREP A, ED / UC   Has planned ENT f/u in June.    Discharge Instructions      You may use over the counter ibuprofen or acetaminophen as needed.  For a sore throat, over the counter products such as [employer] Peroxyl Mouth Sore Rinse or Chloraseptic Sore Throat Spray may provide some temporary relief. Your rapid strep test was negative today. We have sent your throat swab for culture and will let you know of any positive results. Regardless, I would like you to finish all the antibiotic prescription given today.   Reviewed expectations re: course of current medical issues. Questions answered. Outlined signs and symptoms indicating need for more acute intervention. Patient verbalized understanding. After Visit Summary given.   SUBJECTIVE:  Victoria Lopez is a 31 y.o. female who reports a sore throat. Abrupt onset yesterday evening. Symptoms have  significantly worsened  since beginning; without voice changes. No respiratory symptoms. Normal PO intake but reports discomfort with swallowing. No specific  alleviating factors. Fever: believed to be present, temp not taken. No neck pain or swelling. No associated nausea, vomiting, or abdominal pain. Known sick contacts: none. Recent travel: none. Was treated earlier this month for strep; responded quickly to antibiotic.   OBJECTIVE:  Vitals:   02/17/22 1100  BP: (!) 124/99  Pulse: 68  Resp: 18  Temp: 98.8 F (37.1 C)  TempSrc: Oral  SpO2: 98%    General appearance: alert; no distress HEENT: throat with marked erythema and with exudative tonsillar hypertrophy; uvula is midline Neck: supple with FROM; moderate bilateral cervical LAD that is TTP Lungs: speaks full sentences without difficulty; unlabored Abd: soft; non-tender Skin: reveals no rash; warm and dry Psychological: alert and cooperative; normal mood and affect  No Known Allergies  Past Medical History:  Diagnosis Date   Anemia    Complication of anesthesia    BP dropped into 70s with last anesthesia, asymptomatic   Wears glasses    Social History   Socioeconomic History   Marital status: Married    Spouse name: Not on file   Number of children: Not on file   Years of education: Not on file   Highest education level: Not on file  Occupational History   Not on file  Tobacco Use   Smoking status: Former    Years: 2.00    Types: Cigarettes    Quit date: 04/18/2017    Years since quitting: 4.8   Smokeless tobacco: Never  Vaping Use   Vaping Use: Never used  Substance and Sexual Activity   Alcohol use: Yes    Comment: occasional   Drug use: Never   Sexual activity: Yes    Birth control/protection: None  Other Topics Concern   Not on file  Social History Narrative   ** Merged History Encounter **       Social Determinants of Health   Financial Resource Strain: Not on file  Food Insecurity: Not on file  Transportation Needs: Not on file  Physical Activity: Not on file  Stress: Not on file  Social Connections: Not on file  Intimate Partner Violence:  Not on file   Family History  Problem Relation Age of Onset   Diabetes Mother    Hyperlipidemia Father    Hypertension Sister    Asthma Sister    Alcohol abuse Paternal Grandfather    Diabetes Maternal Neomia Dear, Aaron Edelman, MD 02/17/22 1254

## 2022-02-19 LAB — CULTURE, GROUP A STREP (THRC)

## 2022-02-26 ENCOUNTER — Ambulatory Visit (INDEPENDENT_AMBULATORY_CARE_PROVIDER_SITE_OTHER): Payer: Medicaid Other | Admitting: Obstetrics and Gynecology

## 2022-02-26 ENCOUNTER — Other Ambulatory Visit (HOSPITAL_COMMUNITY)
Admission: RE | Admit: 2022-02-26 | Discharge: 2022-02-26 | Disposition: A | Discharge status: Home / Self Care | Payer: Medicaid Other | Source: Ambulatory Visit | Attending: Obstetrics and Gynecology | Admitting: Obstetrics and Gynecology

## 2022-02-26 ENCOUNTER — Encounter: Payer: Self-pay | Admitting: Obstetrics and Gynecology

## 2022-02-26 DIAGNOSIS — Z01419 Encounter for gynecological examination (general) (routine) without abnormal findings: Secondary | ICD-10-CM | POA: Diagnosis not present

## 2022-02-26 DIAGNOSIS — Z9851 Tubal ligation status: Secondary | ICD-10-CM | POA: Insufficient documentation

## 2022-02-26 NOTE — Progress Notes (Signed)
Victoria Lopez is a 31 y.o. [phone] female here for a routine annual gynecologic exam.  Current complaints: None.   Denies abnormal vaginal bleeding, discharge, pelvic pain, problems with intercourse or other gynecologic concerns.    Gynecologic History Patient's last menstrual period was 02/13/2022. Contraception: tubal ligation Last Pap: 2020. Results were: normal Last mammogram: NA.   Obstetric History OB History  Gravida Para Term Preterm AB Living  7 4 4   2 4   SAB IAB Ectopic Multiple Live Births  2     0 4    # Outcome Date GA Lbr Len/2nd Weight Sex Delivery Anes PTL Lv  7 Term 02/17/19 [redacted]w[redacted]d 04:27 / 00:07 7 lb 1.8 oz (3.225 kg) M VBAC EPI  LIV  6 Term 02/08/17 [redacted]w[redacted]d 00:59 / 00:10 6 lb 10.5 oz (3.02 kg) F VBAC EPI  LIV  5 Term 03/04/14 [redacted]w[redacted]d 17:14 / 00:23 7 lb 6.5 oz (3.36 kg) M VBAC EPI  LIV  4 SAB 09/23/11             Birth Comments: 9 week MAB, treated with misoprostol  3 Term 08/15/07 [redacted]w[redacted]d  8 lb 4 oz (3.742 kg) F CS-LTranv EPI N LIV  2 SAB           1 Gravida              Birth Comments: System Generated. Please review and update pregnancy details.    Past Medical History:  Diagnosis Date   Anemia    Complication of anesthesia    BP dropped into 70s with last anesthesia, asymptomatic   Wears glasses     Past Surgical History:  Procedure Laterality Date   CESAREAN SECTION  07/2007   DIAGNOSTIC LAPAROSCOPY WITH REMOVAL OF ECTOPIC PREGNANCY Left 01/03/2021   Procedure: DIAGNOSTIC LAPAROSCOPY WITH REMOVAL OF left ECTOPIC PREGNANCY, bilateral salpingectomy;  Surgeon: 01/05/2021, MD;  Location: [facility];  Service: Gynecology;  Laterality: Left;   LAPAROSCOPIC BILATERAL SALPINGECTOMY Bilateral 04/21/2019   Procedure: LAPAROSCOPIC BILATERAL SALPINGECTOMY;  Surgeon: 04/23/2019, MD;  Location: [facility];  Service: Gynecology;  Laterality: Bilateral;    Current Outpatient Medications on File Prior to Visit  Medication Sig Dispense Refill    cefdinir (OMNICEF) 300 MG capsule Take 1 capsule (300 mg total) by mouth 2 (two) times daily. 20 capsule 0   phentermine 37.5 MG capsule Take 37.5 mg by mouth every morning.     magic mouthwash (lidocaine, diphenhydrAMINE, alum & mag hydroxide) suspension Swish and spit 5 mLs 4 (four) times daily as needed for mouth pain. 360 mL 0   No current facility-administered medications on file prior to visit.    No Known Allergies  Social History   Socioeconomic History   Marital status: Married    Spouse name: Not on file   Number of children: Not on file   Years of education: Not on file   Highest education level: Not on file  Occupational History   Not on file  Tobacco Use   Smoking status: Former    Years: 2.00    Types: Cigarettes    Quit date: 04/18/2017    Years since quitting: 4.8   Smokeless tobacco: Never  Vaping Use   Vaping Use: Never used  Substance and Sexual Activity   Alcohol use: Yes    Comment: occasional   Drug use: Never   Sexual activity: Yes    Birth control/protection: Surgical  Other Topics Concern   Not on file  Social History Narrative   ** Merged History Encounter **       Social Determinants of Health   Financial Resource Strain: Not on file  Food Insecurity: Not on file  Transportation Needs: Not on file  Physical Activity: Not on file  Stress: Not on file  Social Connections: Not on file  Intimate Partner Violence: Not on file    Family History  Problem Relation Age of Onset   Diabetes Mother    Hyperlipidemia Father    Hypertension Sister    Asthma Sister    Alcohol abuse Paternal Grandfather    Diabetes Maternal Aunt     The following portions of the patient's history were reviewed and updated as appropriate: allergies, current medications, past family history, past medical history, past social history, past surgical history and problem list.  Review of Systems Pertinent items noted in HPI and remainder of comprehensive ROS  otherwise negative.   Objective:  BP 122/85   Pulse 72   Ht 5\' 2"  (1.575 m)   Wt 175 lb (79.4 kg)   LMP 02/13/2022   BMI 32.01 kg/m  CONSTITUTIONAL: Well-developed, well-nourished female in no acute distress.  HENT:  Normocephalic, atraumatic, External right and left ear normal. Oropharynx is clear and moist EYES: Conjunctivae and EOM are normal. Pupils are equal, round, and reactive to light. No scleral icterus.  NECK: Normal range of motion, supple, no masses.  Normal thyroid.  SKIN: Skin is warm and dry. No rash noted. Not diaphoretic. No erythema. No pallor. NEUROLGIC: Alert and oriented to person, place, and time. Normal reflexes, muscle tone coordination. No cranial nerve deficit noted. PSYCHIATRIC: Normal mood and affect. Normal behavior. Normal judgment and thought content. CARDIOVASCULAR: Normal heart rate noted, regular rhythm RESPIRATORY: Clear to auscultation bilaterally. Effort and breath sounds normal, no problems with respiration noted. BREASTS: Symmetric in size. No masses, skin changes, nipple drainage, or lymphadenopathy. ABDOMEN: Soft, normal bowel sounds, no distention noted.  No tenderness, rebound or guarding.  PELVIC: Normal appearing external genitalia; normal appearing vaginal mucosa and cervix.  No abnormal discharge noted.  Pap smear obtained.  Normal uterine size, no other palpable masses, no uterine or adnexal tenderness. MUSCULOSKELETAL: Normal range of motion. No tenderness.  No cyanosis, clubbing, or edema.  2+ distal pulses.   Assessment:  Annual gynecologic examination with pap smear   Plan:  Will follow up results of pap smear and manage accordingly. Declined STD tsteing Routine preventative health maintenance measures emphasized. Please refer to After Visit Summary for other counseling recommendations.    02/15/2022, MD, FACOG Attending Obstetrician & Gynecologist Center for [facility], [facility]

## 2022-02-26 NOTE — Patient Instructions (Signed)

## 2022-03-03 LAB — CYTOLOGY - PAP
Comment: NEGATIVE
Comment: NEGATIVE
Comment: NEGATIVE
HPV 16: NEGATIVE
HPV 18 / 45: NEGATIVE
High risk HPV: POSITIVE — AB

## 2022-03-04 ENCOUNTER — Encounter: Payer: Self-pay | Admitting: Obstetrics and Gynecology

## 2022-03-04 DIAGNOSIS — R87612 Low grade squamous intraepithelial lesion on cytologic smear of cervix (LGSIL): Secondary | ICD-10-CM | POA: Insufficient documentation

## 2022-03-05 ENCOUNTER — Other Ambulatory Visit: Payer: Self-pay | Admitting: *Deleted

## 2022-03-05 DIAGNOSIS — B3731 Acute candidiasis of vulva and vagina: Secondary | ICD-10-CM

## 2022-03-05 MED ORDER — FLUCONAZOLE 150 MG PO TABS: 150.0000 mg | ORAL_TABLET | Freq: Once | ORAL | 0 refills | Status: AC

## 2022-03-05 NOTE — Progress Notes (Signed)
TC from pt requesting RX for yeast infection. Yeast seen on Pap 02/26/22. RX sent. In addition, pt was advised of need for colposcopy due to abnormal Pap. Appt already scheduled for colpo. Pt education sent via MyChart.

## 2022-04-17 ENCOUNTER — Encounter: Payer: Self-pay | Admitting: Obstetrics and Gynecology

## 2022-04-17 ENCOUNTER — Other Ambulatory Visit (HOSPITAL_COMMUNITY)
Admission: RE | Admit: 2022-04-17 | Discharge: 2022-04-17 | Disposition: A | Discharge status: Home / Self Care | Payer: Medicaid Other | Source: Ambulatory Visit | Attending: Obstetrics and Gynecology | Admitting: Obstetrics and Gynecology

## 2022-04-17 ENCOUNTER — Ambulatory Visit (INDEPENDENT_AMBULATORY_CARE_PROVIDER_SITE_OTHER): Payer: Medicaid Other | Admitting: Obstetrics and Gynecology

## 2022-04-17 VITALS — BP 108/76 | HR 80 | Wt 174.0 lb

## 2022-04-17 DIAGNOSIS — Z01812 Encounter for preprocedural laboratory examination: Secondary | ICD-10-CM | POA: Diagnosis not present

## 2022-04-17 DIAGNOSIS — R87612 Low grade squamous intraepithelial lesion on cytologic smear of cervix (LGSIL): Secondary | ICD-10-CM

## 2022-04-17 DIAGNOSIS — R87810 Cervical high risk human papillomavirus (HPV) DNA test positive: Secondary | ICD-10-CM | POA: Diagnosis not present

## 2022-04-17 LAB — POCT URINE PREGNANCY: Preg Test, Ur: NEGATIVE

## 2022-04-17 NOTE — Progress Notes (Signed)
Pt presents for colposcopy bx s/p LSIL with HPV pap smear.

## 2022-04-17 NOTE — Progress Notes (Signed)
    GYNECOLOGY CLINIC COLPOSCOPY PROCEDURE NOTE  31 y.o. [id number] here for colposcopy for low-grade squamous intraepithelial neoplasia (LGSIL - encompassing HPV,mild dysplasia,CIN I) pap smear on 6/23. Discussed role for HPV in cervical dysplasia, need for surveillance.  Patient given informed consent, signed copy in the chart, time out was performed.  Placed in lithotomy position. Cervix viewed with speculum and colposcope after application of acetic acid.   Colposcopy adequate? Yes  acetowhite lesion(s) noted at 6 & 12 o'clock; corresponding biopsies obtained.  ECC specimen obtained. Monsel's applied All specimens were labelled and sent to pathology.  Patient was given post procedure instructions.  Will follow up pathology and manage accordingly.  Routine preventative health maintenance measures emphasized.    Nettie Elm, MD, FACOG Attending Obstetrician & Gynecologist Center for [facility], [facility]

## 2022-04-17 NOTE — Patient Instructions (Signed)
Colposcopy, Care After  The following information offers guidance on how to care for yourself after your procedure. Your doctor may also give you more specific instructions. If you have problems or questions, contact your doctor. What can I expect after the procedure? If you did not have a sample of your tissue taken out (did not have a biopsy), you may only have some spotting of blood for a few days. You can go back to your normal activities. If you had a sample of your tissue taken out, it is common to have: Soreness and mild pain. These may last for a few days. Mild bleeding or fluid (discharge) coming from your vagina. The fluid will look dark and grainy. You may have this for a few days. The fluid may be caused by a liquid that was used during your procedure. You may need to wear a sanitary pad. Spotting of blood for at least 48 hours after the procedure. Follow these instructions at home: Medicines Take over-the-counter and prescription medicines only as told by your doctor. Ask your doctor what over-the-counter pain medicines and prescription medicines you can start taking again. This is very important if you take blood thinners. Activity For at least 3 days, or for as long as told by your doctor, avoid: Douching. Using tampons. Having sex. Return to your normal activities as told by your doctor. Ask your doctor what activities are safe for you. General instructions Ask your doctor if you may take baths, swim, or use a hot tub. You may take showers. If you use birth control (contraception), keep using it. Keep all follow-up visits. Contact a doctor if: You have a fever or chills. You faint or feel light-headed. Get help right away if: You bleed a lot from your vagina. A lot of bleeding means that the bleeding soaks through a pad in less than 1 hour. You have clumps of blood (blood clots) coming from your vagina. You have signs that could mean you have an infection. This may be  fluid coming from your vagina that is: Different than normal. Yellow. Bad-smelling. You have very bad pain or cramps in your lower belly that do not get better with medicine. Summary If you did not have a sample of your tissue taken out, you may only have some spotting of blood for a few days. You can go back to your normal activities. If you had a sample of your tissue taken out, it is common to have mild pain for a few days and spotting for 48 hours. Avoid douching, using tampons, and having sex for at least 3 days after the procedure or for as long as told. Get help right away if you have a lot of bleeding, very bad pain, or signs of infection. This information is not intended to replace advice given to you by your health care provider. Make sure you discuss any questions you have with your health care provider. Document Revised: 02/02/2021 Document Reviewed: 02/02/2021 Elsevier Patient Education  2023 Elsevier Inc.  

## 2022-04-20 LAB — SURGICAL PATHOLOGY

## 2022-05-15 IMAGING — US US OB < 14 WKS - US OB TV - US DOPPLER
1 series · 12 of 28 positions shown · non-contrast
Comparison: None.
COMPARISON: None.

Addendum:
CLINICAL DATA: Pelvic pain, positive urine pregnancy test, LMP
12/04/2020. Remote tubal ligation.



[Series 1: us ob comp less 14 wks · 12 of 54 slices shown]
[im 2/54]
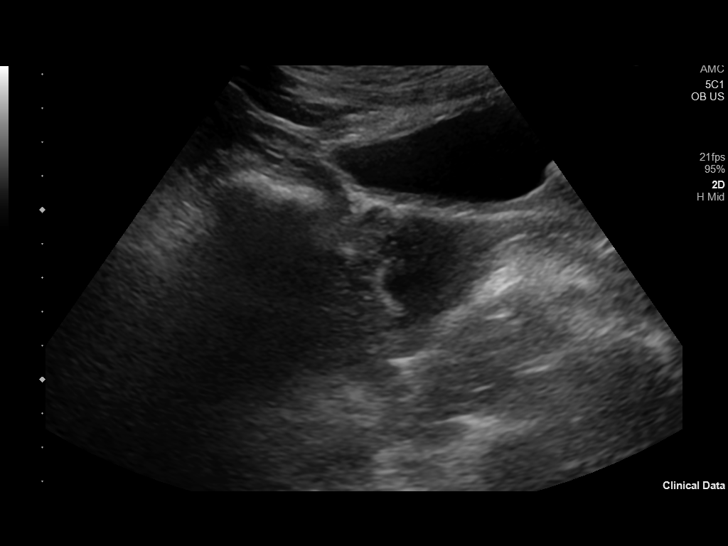
[im 6/54]
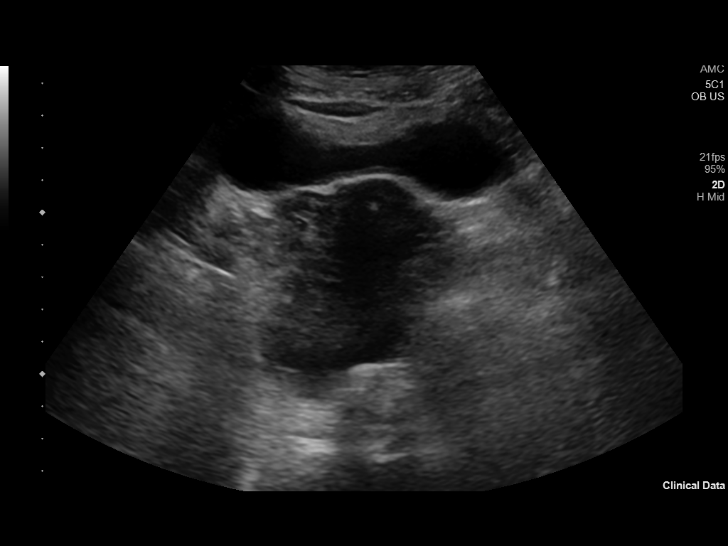
[im 10/54]
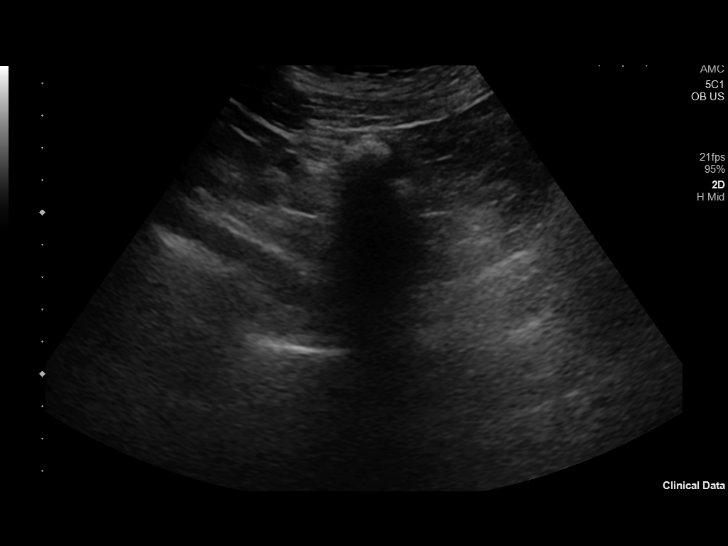
[im 16/54]
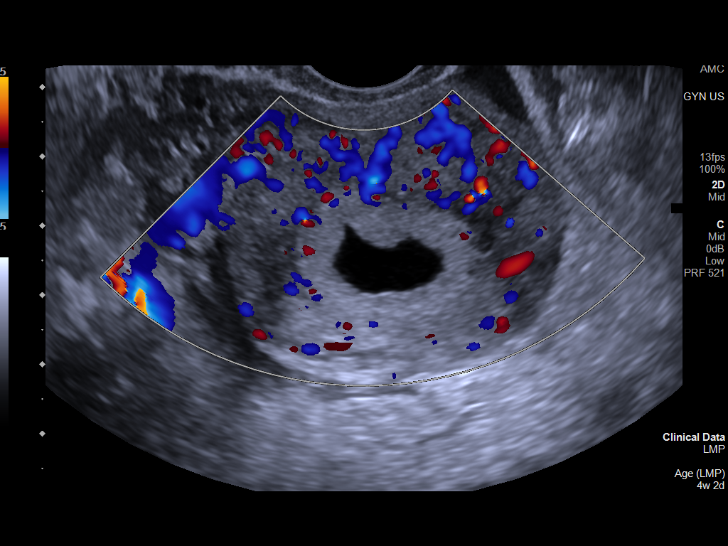
[im 20/54]
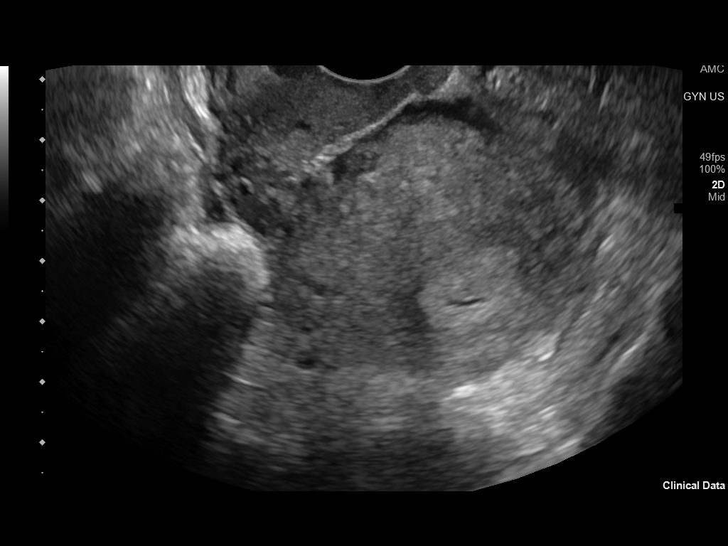
[im 24/54]
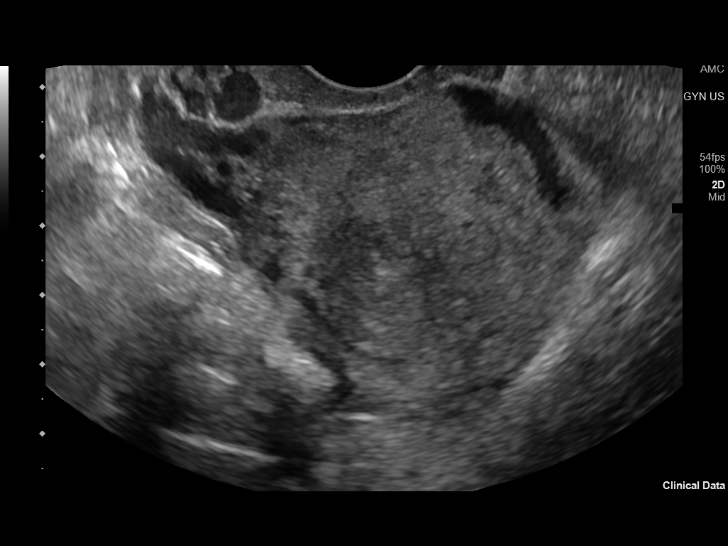
[im 30/54]
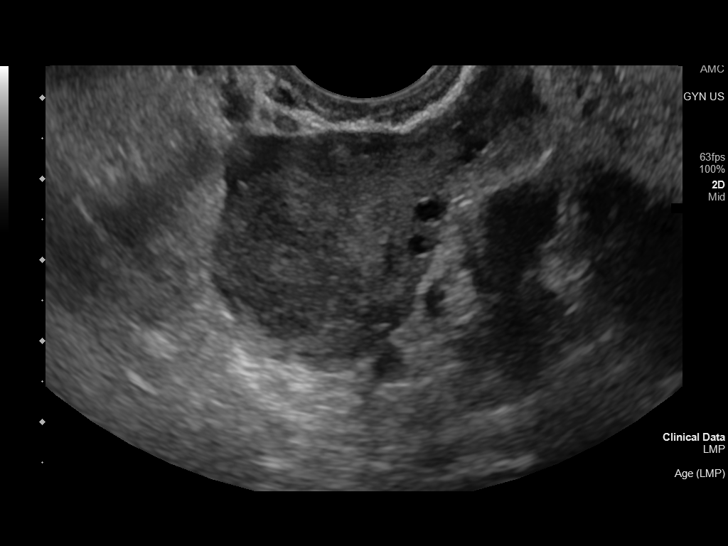
[im 34/54]
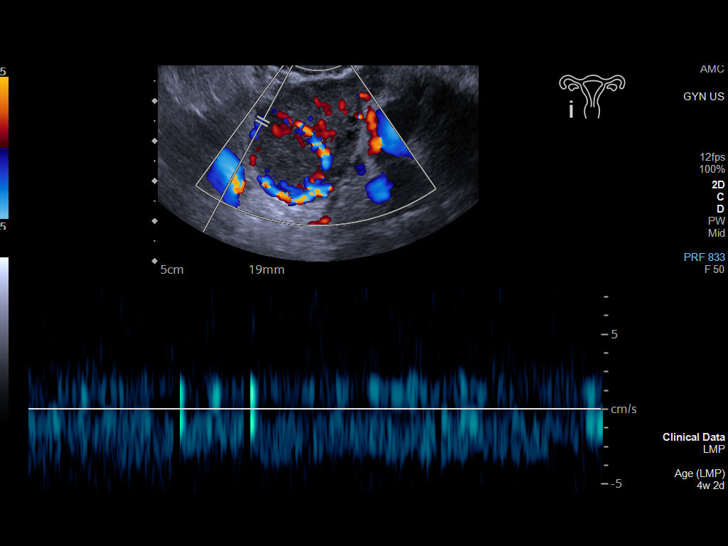
[im 38/54]
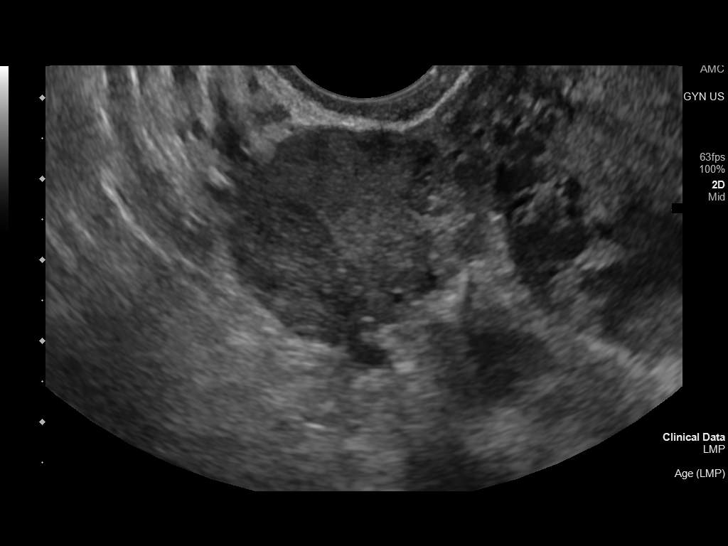
[im 44/54]
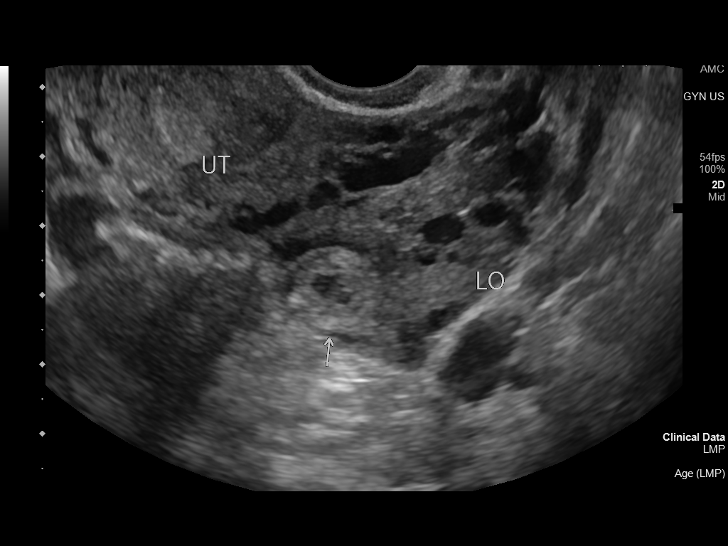
[im 48/54]
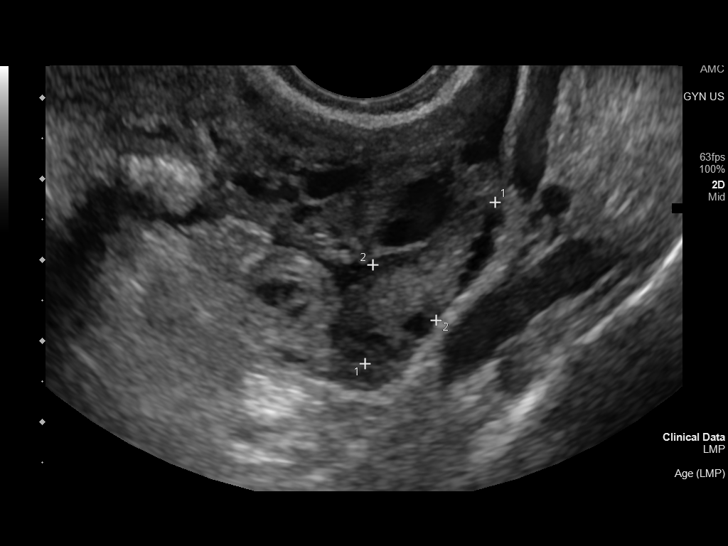
[im 52/54]
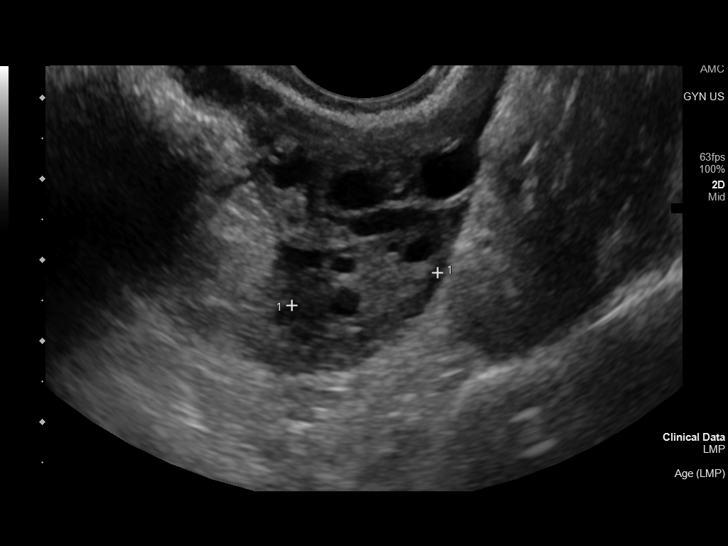

[12 of 28 positions shown; findings below may reference images not displayed]

FINDINGS: Intrauterine gestational sac: None identified.

Maternal uterus/adnexae: There is a small amount of simple appearing
free fluid seen within the endometrial cavity, however, a discrete
gestational sac or associated structures are not identified. No
intrauterine masses are seen. The cervix is unremarkable save for
multiple nabothian cysts.

There is small complex fluid demonstrating internal echogenicity
likely representing proteinaceous or hemorrhagic fluid.

The ovaries are identified bilaterally. The right ovary measures
x 2.0 x 2.1 cm a demonstrates normal color flow vascularity. A
complex luteum is identified within the right ovary. Multiple small
follicles are identified. On the left, the left ovary measures 2.6 x
1.0 x 1.8 cm, demonstrates appropriate color flow vascularity, and
demonstrates multiple internal follicles.

Adjacent to the left ovary is a complex cystic structure measuring
1.4 x 1.2 x 1.2 cm possibly representing an adnexal ectopic
pregnancy.

Pulsed Doppler evaluation of both ovaries demonstrates normal
appearing low-resistance arterial and venous waveforms.
IMPRESSION: No intrauterine gestational sac identified. Fluid within the
endometrial cavity may represent a pseudo gestational sac.

1.4 cm complex cystic lesion within the left adnexa separate from
the left ovary suspicious for an adnexal ectopic pregnancy. Small
hemorrhagic fluid noted present within the pelvis.

ADDENDUM:
These results were called by telephone at the time of interpretation
on 01/03/2021 at [DATE] to provider TACKO ISAMBERT , who verbally
acknowledged these results.

*** End of Addendum ***
FINDINGS: Intrauterine gestational sac: None identified.

Maternal uterus/adnexae: There is a small amount of simple appearing
free fluid seen within the endometrial cavity, however, a discrete
gestational sac or associated structures are not identified. No
intrauterine masses are seen. The cervix is unremarkable save for
multiple nabothian cysts.

There is small complex fluid demonstrating internal echogenicity
likely representing proteinaceous or hemorrhagic fluid.

The ovaries are identified bilaterally. The right ovary measures
x 2.0 x 2.1 cm a demonstrates normal color flow vascularity. A
complex luteum is identified within the right ovary. Multiple small
follicles are identified. On the left, the left ovary measures 2.6 x
1.0 x 1.8 cm, demonstrates appropriate color flow vascularity, and
demonstrates multiple internal follicles.

Adjacent to the left ovary is a complex cystic structure measuring
1.4 x 1.2 x 1.2 cm possibly representing an adnexal ectopic
pregnancy.

Pulsed Doppler evaluation of both ovaries demonstrates normal
appearing low-resistance arterial and venous waveforms.
IMPRESSION: No intrauterine gestational sac identified. Fluid within the
endometrial cavity may represent a pseudo gestational sac.

1.4 cm complex cystic lesion within the left adnexa separate from
the left ovary suspicious for an adnexal ectopic pregnancy. Small
hemorrhagic fluid noted present within the pelvis.

## 2022-05-20 ENCOUNTER — Encounter: Payer: Self-pay | Admitting: Obstetrics and Gynecology

## 2022-05-20 ENCOUNTER — Telehealth: Payer: Medicaid Other | Admitting: Obstetrics and Gynecology

## 2022-05-20 DIAGNOSIS — R87612 Low grade squamous intraepithelial lesion on cytologic smear of cervix (LGSIL): Secondary | ICD-10-CM

## 2022-05-20 NOTE — Progress Notes (Signed)
Unable to connect with pt.

## 2022-05-20 NOTE — Progress Notes (Signed)
Pt for follow up colpo results.  Pt states no concerns today.

## 2022-09-30 ENCOUNTER — Emergency Department (HOSPITAL_BASED_OUTPATIENT_CLINIC_OR_DEPARTMENT_OTHER)
Admission: EM | Admit: 2022-09-30 | Discharge: 2022-09-30 | Disposition: A | Discharge status: Home / Self Care | Payer: Medicaid Other | Attending: Emergency Medicine | Admitting: Emergency Medicine

## 2022-09-30 ENCOUNTER — Encounter (HOSPITAL_BASED_OUTPATIENT_CLINIC_OR_DEPARTMENT_OTHER): Payer: Self-pay

## 2022-09-30 ENCOUNTER — Other Ambulatory Visit: Payer: Self-pay

## 2022-09-30 DIAGNOSIS — J101 Influenza due to other identified influenza virus with other respiratory manifestations: Secondary | ICD-10-CM | POA: Diagnosis not present

## 2022-09-30 DIAGNOSIS — Z1152 Encounter for screening for COVID-19: Secondary | ICD-10-CM | POA: Insufficient documentation

## 2022-09-30 DIAGNOSIS — R059 Cough, unspecified: Secondary | ICD-10-CM | POA: Diagnosis present

## 2022-09-30 DIAGNOSIS — J029 Acute pharyngitis, unspecified: Secondary | ICD-10-CM

## 2022-09-30 LAB — RESP PANEL BY RT-PCR (RSV, FLU A&B, COVID)  RVPGX2
Influenza A by PCR: NEGATIVE
Influenza B by PCR: POSITIVE — AB
Resp Syncytial Virus by PCR: NEGATIVE
SARS Coronavirus 2 by RT PCR: NEGATIVE

## 2022-09-30 LAB — GROUP A STREP BY PCR: Group A Strep by PCR: NOT DETECTED

## 2022-09-30 MED ORDER — DEXAMETHASONE SODIUM PHOSPHATE 10 MG/ML IJ SOLN
10.0000 mg | Freq: Once | INTRAMUSCULAR | Status: AC
  Administered 2022-09-30: 10 mg via INTRAMUSCULAR
  Filled 2022-09-30: qty 1

## 2022-09-30 NOTE — ED Provider Notes (Signed)
[facility] EMERGENCY DEPT Provider Note   CSN: [id number] Arrival date & time: 09/30/22  1251     History  Chief Complaint  Patient presents with   Cough    Victoria Lopez is a 32 y.o. female.  Patient presents the emergency department complaining of 1 week of productive cough, headaches, body aches, chills, and subjective fever.  Patient states the symptoms started approximately New Year's Eve to New Year's Day.  She denies shortness of breath, chest pain, abdominal pain, nausea, vomiting.  Past medical history significant for anemia  HPI     Home Medications Prior to Admission medications   Medication Sig Start Date End Date Taking? Authorizing Provider  phentermine 37.5 MG capsule Take 37.5 mg by mouth every morning.    [provider]      Allergies    Patient has no known allergies.    Review of Systems   Review of Systems  Constitutional:  Positive for chills and fever.  Respiratory:  Positive for cough.   Musculoskeletal:  Positive for myalgias.  Neurological:  Positive for headaches.    Physical Exam Updated Vital Signs BP 138/64 (BP Location: Right Arm)   Pulse 99   Temp 98.9 F (37.2 C) (Oral)   Resp 18   Ht 5\' 2"  (1.575 m)   Wt 78.9 kg   SpO2 99%   BMI 31.81 kg/m  Physical Exam Vitals and nursing note reviewed.  Constitutional:      General: She is not in acute distress.    Appearance: She is well-developed.  HENT:     Head: Normocephalic and atraumatic.     Nose: Nose normal.     Mouth/Throat:     Pharynx: Posterior oropharyngeal erythema present. No oropharyngeal exudate.  Eyes:     Conjunctiva/sclera: Conjunctivae normal.  Cardiovascular:     Rate and Rhythm: Normal rate and regular rhythm.     Heart sounds: No murmur heard. Pulmonary:     Effort: Pulmonary effort is normal. No respiratory distress.     Breath sounds: Normal breath sounds.  Abdominal:     Palpations: Abdomen is soft.     Tenderness: There is  no abdominal tenderness.  Musculoskeletal:        General: No swelling.     Cervical back: Neck supple.  Skin:    General: Skin is warm and dry.     Capillary Refill: Capillary refill takes less than 2 seconds.  Neurological:     Mental Status: She is alert.  Psychiatric:        Mood and Affect: Mood normal.     ED Results / Procedures / Treatments   Labs (all labs ordered are listed, but only abnormal results are displayed) Labs Reviewed  RESP PANEL BY RT-PCR (RSV, FLU A&B, COVID)  [id number] - Abnormal; Notable for the following components:      Result Value   Influenza B by PCR POSITIVE (*)    All other components within normal limits  GROUP A STREP BY PCR    EKG None  Radiology No results found.  Procedures Procedures    Medications Ordered in ED Medications  dexamethasone (DECADRON) injection 10 mg (10 mg Intramuscular Given 09/30/22 1335)    ED Course/ Medical Decision Making/ A&P                           Medical Decision Making Risk Prescription drug management.   Patient presents with  chief complaint of upper respiratory symptoms.  Differential diagnosis includes but is not limited to COVID-19, influenza, RSV, and others  I reviewed the patient's past medical history.  The patient has no recent relevant medical visits for review  I ordered and reviewed lab results.  Pertinent results include positive influenza B test result.  I ordered the patient a Decadron shot to help with overall inflammation.   Plan to discharge patient home at this time with instructions for supportive care.  If the patient's group A strep test result comes back positive I will send in an antibiotic.  Patient voices understanding these instructions.  Patient plans to get rest and hydrate as she is able at home.  Return precautions provided       Final Clinical Impression(s) / ED Diagnoses Final diagnoses:  Influenza B  Sore throat    Rx / DC Orders ED Discharge Orders      None         Ronny Bacon 09/30/22 1415    Lennice Sites, DO 09/30/22 1440

## 2022-09-30 NOTE — ED Notes (Signed)
ED Provider at bedside. 

## 2022-09-30 NOTE — Discharge Instructions (Addendum)
You were diagnosed today with influenza.  Please use over-the-counter medications such as ibuprofen and acetaminophen as needed for fever and pain control.  Please be sure to get plenty of rest and hydrate as you are able.  If your strep test result is positive I will send an antibiotic to your pharmacy.  If you develop any life-threatening symptoms please return to the emergency department.

## 2022-09-30 NOTE — ED Triage Notes (Signed)
Patient here POV from Home.  Endorses Productive Cough, Headaches, Body Aches, Chills, Possible fevers, Sore Throat for 10 Days.  NAD Noted during Triage. A&Ox4. GCS 15. Ambulatory.

## 2023-11-24 ENCOUNTER — Other Ambulatory Visit (HOSPITAL_BASED_OUTPATIENT_CLINIC_OR_DEPARTMENT_OTHER): Payer: Self-pay

## 2023-11-24 MED ORDER — WEGOVY 0.5 MG/0.5ML ~~LOC~~ SOAJ
0.5000 mg | SUBCUTANEOUS | 0 refills | Status: DC
  Filled 2023-11-24: qty 2, 28d supply, fill #0

## 2023-11-25 ENCOUNTER — Other Ambulatory Visit (HOSPITAL_BASED_OUTPATIENT_CLINIC_OR_DEPARTMENT_OTHER): Payer: Self-pay

## 2023-11-25 ENCOUNTER — Other Ambulatory Visit (HOSPITAL_COMMUNITY): Payer: Self-pay

## 2023-11-27 ENCOUNTER — Other Ambulatory Visit (HOSPITAL_BASED_OUTPATIENT_CLINIC_OR_DEPARTMENT_OTHER): Payer: Self-pay

## 2023-12-24 ENCOUNTER — Other Ambulatory Visit (HOSPITAL_BASED_OUTPATIENT_CLINIC_OR_DEPARTMENT_OTHER): Payer: Self-pay

## 2023-12-24 MED ORDER — ONDANSETRON 4 MG PO TBDP
4.0000 mg | ORAL_TABLET | Freq: Three times a day (TID) | ORAL | 0 refills | Status: AC | PRN
  Filled 2023-12-24: qty 45, 15d supply, fill #0

## 2023-12-28 ENCOUNTER — Other Ambulatory Visit (HOSPITAL_BASED_OUTPATIENT_CLINIC_OR_DEPARTMENT_OTHER): Payer: Self-pay

## 2023-12-28 MED ORDER — WEGOVY 0.5 MG/0.5ML ~~LOC~~ SOAJ
0.5000 mg | SUBCUTANEOUS | 0 refills | Status: AC
  Filled 2023-12-28: qty 2, 28d supply, fill #0

## 2024-01-20 ENCOUNTER — Other Ambulatory Visit (HOSPITAL_BASED_OUTPATIENT_CLINIC_OR_DEPARTMENT_OTHER): Payer: Self-pay

## 2024-01-21 ENCOUNTER — Other Ambulatory Visit (HOSPITAL_BASED_OUTPATIENT_CLINIC_OR_DEPARTMENT_OTHER): Payer: Self-pay

## 2024-02-21 ENCOUNTER — Other Ambulatory Visit (HOSPITAL_BASED_OUTPATIENT_CLINIC_OR_DEPARTMENT_OTHER): Payer: Self-pay

## 2024-02-21 MED ORDER — WEGOVY 1 MG/0.5ML ~~LOC~~ SOAJ
1.0000 mg | SUBCUTANEOUS | 0 refills | Status: AC
  Filled 2024-02-21: qty 2, 28d supply, fill #0

## 2024-03-17 ENCOUNTER — Other Ambulatory Visit (HOSPITAL_BASED_OUTPATIENT_CLINIC_OR_DEPARTMENT_OTHER): Payer: Self-pay

## 2024-03-17 MED ORDER — WEGOVY 1.7 MG/0.75ML ~~LOC~~ SOAJ
1.7000 mg | SUBCUTANEOUS | 0 refills | Status: AC
  Filled 2024-03-17: qty 6, 84d supply, fill #0

## 2024-06-16 ENCOUNTER — Other Ambulatory Visit (HOSPITAL_BASED_OUTPATIENT_CLINIC_OR_DEPARTMENT_OTHER): Payer: Self-pay

## 2024-06-16 MED ORDER — PHENTERMINE HCL 37.5 MG PO TABS
37.5000 mg | ORAL_TABLET | Freq: Every day | ORAL | 0 refills | Status: AC
  Filled 2024-06-16 – 2024-07-20 (×2): qty 30, 30d supply, fill #0

## 2024-06-26 ENCOUNTER — Other Ambulatory Visit (HOSPITAL_BASED_OUTPATIENT_CLINIC_OR_DEPARTMENT_OTHER): Payer: Self-pay

## 2024-07-20 ENCOUNTER — Other Ambulatory Visit (HOSPITAL_BASED_OUTPATIENT_CLINIC_OR_DEPARTMENT_OTHER): Payer: Self-pay
